# Patient Record
Sex: Male | Born: 1945 | Race: White | Hispanic: No | Marital: Married | Smoking: Former smoker
Health system: Southern US, Community
[De-identification: ages and names within clinical notes are randomized; demographics above are authoritative.]

## PROBLEM LIST (undated history)

## (undated) DIAGNOSIS — E669 Obesity, unspecified: Secondary | ICD-10-CM

## (undated) DIAGNOSIS — R079 Chest pain, unspecified: Secondary | ICD-10-CM

## (undated) DIAGNOSIS — E1169 Type 2 diabetes mellitus with other specified complication: Secondary | ICD-10-CM

## (undated) DIAGNOSIS — E785 Hyperlipidemia, unspecified: Secondary | ICD-10-CM

## (undated) DIAGNOSIS — I480 Paroxysmal atrial fibrillation: Secondary | ICD-10-CM

## (undated) DIAGNOSIS — I10 Essential (primary) hypertension: Secondary | ICD-10-CM

## (undated) DIAGNOSIS — I350 Nonrheumatic aortic (valve) stenosis: Secondary | ICD-10-CM

## (undated) DIAGNOSIS — G4730 Sleep apnea, unspecified: Secondary | ICD-10-CM

## (undated) DIAGNOSIS — G4733 Obstructive sleep apnea (adult) (pediatric): Secondary | ICD-10-CM

## (undated) HISTORY — DX: Paroxysmal atrial fibrillation: I48.0

## (undated) HISTORY — DX: Nonrheumatic aortic (valve) stenosis: I35.0

## (undated) HISTORY — DX: Sleep apnea, unspecified: G47.30

## (undated) HISTORY — DX: Type 2 diabetes mellitus with other specified complication: E11.69

## (undated) HISTORY — DX: Obesity, unspecified: E66.9

## (undated) HISTORY — DX: Chest pain, unspecified: R07.9

## (undated) HISTORY — DX: Obstructive sleep apnea (adult) (pediatric): G47.33

## (undated) HISTORY — DX: Essential (primary) hypertension: I10

## (undated) HISTORY — DX: Hyperlipidemia, unspecified: E78.5

---

## 2002-04-24 ENCOUNTER — Encounter: Admission: RE | Admit: 2002-04-24 | Discharge: 2002-04-24 | Payer: Self-pay | Admitting: Emergency Medicine

## 2002-04-24 ENCOUNTER — Encounter: Payer: Self-pay | Admitting: Emergency Medicine

## 2003-05-20 ENCOUNTER — Encounter: Admission: RE | Admit: 2003-05-20 | Discharge: 2003-05-20 | Payer: Self-pay | Admitting: Emergency Medicine

## 2004-07-24 ENCOUNTER — Encounter: Admission: RE | Admit: 2004-07-24 | Discharge: 2004-07-24 | Payer: Self-pay | Admitting: Emergency Medicine

## 2005-07-20 ENCOUNTER — Ambulatory Visit (HOSPITAL_COMMUNITY): Admission: RE | Admit: 2005-07-20 | Discharge: 2005-07-20 | Payer: Self-pay | Admitting: Cardiology

## 2005-07-20 ENCOUNTER — Encounter (INDEPENDENT_AMBULATORY_CARE_PROVIDER_SITE_OTHER): Payer: Self-pay | Admitting: Cardiology

## 2006-01-06 ENCOUNTER — Encounter: Admission: RE | Admit: 2006-01-06 | Discharge: 2006-01-06 | Payer: Self-pay | Admitting: Emergency Medicine

## 2006-01-08 ENCOUNTER — Encounter: Admission: RE | Admit: 2006-01-08 | Discharge: 2006-01-08 | Payer: Self-pay | Admitting: Emergency Medicine

## 2006-04-14 ENCOUNTER — Encounter: Admission: RE | Admit: 2006-04-14 | Discharge: 2006-04-14 | Payer: Self-pay | Admitting: Emergency Medicine

## 2008-12-06 ENCOUNTER — Encounter: Admission: RE | Admit: 2008-12-06 | Discharge: 2008-12-06 | Payer: Self-pay | Admitting: Cardiovascular Disease

## 2008-12-12 ENCOUNTER — Ambulatory Visit (HOSPITAL_COMMUNITY): Admission: RE | Admit: 2008-12-12 | Discharge: 2008-12-12 | Payer: Self-pay | Admitting: Cardiovascular Disease

## 2008-12-12 HISTORY — PX: CARDIAC CATHETERIZATION: SHX172

## 2009-01-01 ENCOUNTER — Ambulatory Visit: Payer: Self-pay | Admitting: Cardiothoracic Surgery

## 2009-01-10 ENCOUNTER — Ambulatory Visit: Payer: Self-pay | Admitting: Cardiothoracic Surgery

## 2009-01-13 ENCOUNTER — Encounter: Payer: Self-pay | Admitting: Cardiothoracic Surgery

## 2009-01-13 ENCOUNTER — Ambulatory Visit (HOSPITAL_COMMUNITY): Admission: RE | Admit: 2009-01-13 | Discharge: 2009-01-13 | Payer: Self-pay | Admitting: Cardiothoracic Surgery

## 2009-01-15 ENCOUNTER — Inpatient Hospital Stay (HOSPITAL_COMMUNITY): Admission: RE | Admit: 2009-01-15 | Discharge: 2009-01-22 | Payer: Self-pay | Admitting: Cardiothoracic Surgery

## 2009-01-15 ENCOUNTER — Ambulatory Visit: Payer: Self-pay | Admitting: Cardiothoracic Surgery

## 2009-01-15 HISTORY — PX: AORTIC VALVE REPLACEMENT: SHX41

## 2009-02-17 ENCOUNTER — Encounter: Admission: RE | Admit: 2009-02-17 | Discharge: 2009-02-17 | Payer: Self-pay | Admitting: Cardiothoracic Surgery

## 2009-02-17 ENCOUNTER — Ambulatory Visit: Payer: Self-pay | Admitting: Cardiothoracic Surgery

## 2009-02-26 ENCOUNTER — Ambulatory Visit: Payer: Self-pay | Admitting: Cardiothoracic Surgery

## 2009-03-26 ENCOUNTER — Encounter: Admission: RE | Admit: 2009-03-26 | Discharge: 2009-03-26 | Payer: Self-pay | Admitting: Cardiothoracic Surgery

## 2009-03-26 ENCOUNTER — Ambulatory Visit: Payer: Self-pay | Admitting: Cardiothoracic Surgery

## 2009-04-09 ENCOUNTER — Ambulatory Visit: Payer: Self-pay | Admitting: Cardiothoracic Surgery

## 2009-04-17 ENCOUNTER — Encounter: Payer: Self-pay | Admitting: Cardiothoracic Surgery

## 2009-04-17 ENCOUNTER — Ambulatory Visit: Admission: RE | Admit: 2009-04-17 | Discharge: 2009-04-17 | Payer: Self-pay | Admitting: Cardiothoracic Surgery

## 2009-04-21 ENCOUNTER — Inpatient Hospital Stay (HOSPITAL_COMMUNITY): Admission: RE | Admit: 2009-04-21 | Discharge: 2009-04-26 | Payer: Self-pay | Admitting: Cardiothoracic Surgery

## 2009-04-21 ENCOUNTER — Encounter: Payer: Self-pay | Admitting: Cardiothoracic Surgery

## 2009-04-21 HISTORY — PX: OTHER SURGICAL HISTORY: SHX169

## 2009-04-24 ENCOUNTER — Ambulatory Visit: Payer: Self-pay | Admitting: Cardiothoracic Surgery

## 2009-05-07 ENCOUNTER — Encounter: Admission: RE | Admit: 2009-05-07 | Discharge: 2009-05-07 | Payer: Self-pay | Admitting: Cardiothoracic Surgery

## 2009-05-07 ENCOUNTER — Ambulatory Visit: Payer: Self-pay | Admitting: Cardiothoracic Surgery

## 2009-05-28 ENCOUNTER — Ambulatory Visit: Payer: Self-pay | Admitting: Cardiothoracic Surgery

## 2009-08-06 ENCOUNTER — Encounter: Admission: RE | Admit: 2009-08-06 | Discharge: 2009-08-06 | Payer: Self-pay | Admitting: Cardiothoracic Surgery

## 2009-08-06 ENCOUNTER — Ambulatory Visit: Payer: Self-pay | Admitting: Cardiothoracic Surgery

## 2009-12-17 ENCOUNTER — Encounter: Admission: RE | Admit: 2009-12-17 | Discharge: 2009-12-17 | Payer: Self-pay | Admitting: Cardiothoracic Surgery

## 2009-12-17 ENCOUNTER — Ambulatory Visit: Payer: Self-pay | Admitting: Cardiothoracic Surgery

## 2010-02-22 ENCOUNTER — Encounter: Payer: Self-pay | Admitting: Cardiothoracic Surgery

## 2010-02-22 ENCOUNTER — Encounter: Payer: Self-pay | Admitting: Emergency Medicine

## 2010-04-27 LAB — CBC
HCT: 29.7 % — ABNORMAL LOW (ref 39.0–52.0)
HCT: 32.5 % — ABNORMAL LOW (ref 39.0–52.0)
HCT: 32.6 % — ABNORMAL LOW (ref 39.0–52.0)
HCT: 35.5 % — ABNORMAL LOW (ref 39.0–52.0)
HCT: 43 % (ref 39.0–52.0)
Hemoglobin: 10.1 g/dL — ABNORMAL LOW (ref 13.0–17.0)
Hemoglobin: 10.9 g/dL — ABNORMAL LOW (ref 13.0–17.0)
Hemoglobin: 11 g/dL — ABNORMAL LOW (ref 13.0–17.0)
Hemoglobin: 12.1 g/dL — ABNORMAL LOW (ref 13.0–17.0)
Hemoglobin: 14.1 g/dL (ref 13.0–17.0)
MCHC: 32.8 g/dL (ref 30.0–36.0)
MCHC: 33.7 g/dL (ref 30.0–36.0)
MCHC: 33.7 g/dL (ref 30.0–36.0)
MCHC: 34.1 g/dL (ref 30.0–36.0)
MCHC: 34.1 g/dL (ref 30.0–36.0)
MCV: 87.6 fL (ref 78.0–100.0)
MCV: 88.4 fL (ref 78.0–100.0)
MCV: 88.7 fL (ref 78.0–100.0)
MCV: 88.7 fL (ref 78.0–100.0)
MCV: 88.8 fL (ref 78.0–100.0)
Platelets: 115 10*3/uL — ABNORMAL LOW (ref 150–400)
Platelets: 136 10*3/uL — ABNORMAL LOW (ref 150–400)
Platelets: 137 10*3/uL — ABNORMAL LOW (ref 150–400)
Platelets: 149 10*3/uL — ABNORMAL LOW (ref 150–400)
Platelets: 165 10*3/uL (ref 150–400)
RBC: 3.35 MIL/uL — ABNORMAL LOW (ref 4.22–5.81)
RBC: 3.66 MIL/uL — ABNORMAL LOW (ref 4.22–5.81)
RBC: 3.73 MIL/uL — ABNORMAL LOW (ref 4.22–5.81)
RBC: 4 MIL/uL — ABNORMAL LOW (ref 4.22–5.81)
RBC: 4.86 MIL/uL (ref 4.22–5.81)
RDW: 15.4 % (ref 11.5–15.5)
RDW: 15.6 % — ABNORMAL HIGH (ref 11.5–15.5)
RDW: 15.8 % — ABNORMAL HIGH (ref 11.5–15.5)
RDW: 15.8 % — ABNORMAL HIGH (ref 11.5–15.5)
RDW: 15.8 % — ABNORMAL HIGH (ref 11.5–15.5)
WBC: 12.2 10*3/uL — ABNORMAL HIGH (ref 4.0–10.5)
WBC: 5.6 10*3/uL (ref 4.0–10.5)
WBC: 7.4 10*3/uL (ref 4.0–10.5)
WBC: 8.2 10*3/uL (ref 4.0–10.5)
WBC: 8.6 10*3/uL (ref 4.0–10.5)

## 2010-04-27 LAB — GLUCOSE, CAPILLARY
Glucose-Capillary: 101 mg/dL — ABNORMAL HIGH (ref 70–99)
Glucose-Capillary: 107 mg/dL — ABNORMAL HIGH (ref 70–99)
Glucose-Capillary: 107 mg/dL — ABNORMAL HIGH (ref 70–99)
Glucose-Capillary: 108 mg/dL — ABNORMAL HIGH (ref 70–99)
Glucose-Capillary: 110 mg/dL — ABNORMAL HIGH (ref 70–99)
Glucose-Capillary: 113 mg/dL — ABNORMAL HIGH (ref 70–99)
Glucose-Capillary: 122 mg/dL — ABNORMAL HIGH (ref 70–99)
Glucose-Capillary: 122 mg/dL — ABNORMAL HIGH (ref 70–99)
Glucose-Capillary: 122 mg/dL — ABNORMAL HIGH (ref 70–99)
Glucose-Capillary: 123 mg/dL — ABNORMAL HIGH (ref 70–99)
Glucose-Capillary: 129 mg/dL — ABNORMAL HIGH (ref 70–99)
Glucose-Capillary: 133 mg/dL — ABNORMAL HIGH (ref 70–99)
Glucose-Capillary: 133 mg/dL — ABNORMAL HIGH (ref 70–99)
Glucose-Capillary: 138 mg/dL — ABNORMAL HIGH (ref 70–99)
Glucose-Capillary: 144 mg/dL — ABNORMAL HIGH (ref 70–99)
Glucose-Capillary: 147 mg/dL — ABNORMAL HIGH (ref 70–99)
Glucose-Capillary: 151 mg/dL — ABNORMAL HIGH (ref 70–99)
Glucose-Capillary: 152 mg/dL — ABNORMAL HIGH (ref 70–99)
Glucose-Capillary: 153 mg/dL — ABNORMAL HIGH (ref 70–99)
Glucose-Capillary: 155 mg/dL — ABNORMAL HIGH (ref 70–99)
Glucose-Capillary: 164 mg/dL — ABNORMAL HIGH (ref 70–99)
Glucose-Capillary: 165 mg/dL — ABNORMAL HIGH (ref 70–99)
Glucose-Capillary: 174 mg/dL — ABNORMAL HIGH (ref 70–99)
Glucose-Capillary: 176 mg/dL — ABNORMAL HIGH (ref 70–99)
Glucose-Capillary: 69 mg/dL — ABNORMAL LOW (ref 70–99)
Glucose-Capillary: 86 mg/dL (ref 70–99)
Glucose-Capillary: 88 mg/dL (ref 70–99)
Glucose-Capillary: 95 mg/dL (ref 70–99)
Glucose-Capillary: 99 mg/dL (ref 70–99)

## 2010-04-27 LAB — POCT I-STAT 3, ART BLOOD GAS (G3+)
Acid-base deficit: 2 mmol/L (ref 0.0–2.0)
Acid-base deficit: 3 mmol/L — ABNORMAL HIGH (ref 0.0–2.0)
Bicarbonate: 22.6 mEq/L (ref 20.0–24.0)
Bicarbonate: 24.3 mEq/L — ABNORMAL HIGH (ref 20.0–24.0)
O2 Saturation: 87 %
O2 Saturation: 88 %
Patient temperature: 97.5
Patient temperature: 98.3
TCO2: 24 mmol/L (ref 0–100)
TCO2: 26 mmol/L (ref 0–100)
pCO2 arterial: 41.2 mmHg (ref 35.0–45.0)
pCO2 arterial: 45.2 mmHg — ABNORMAL HIGH (ref 35.0–45.0)
pH, Arterial: 7.338 — ABNORMAL LOW (ref 7.350–7.450)
pH, Arterial: 7.345 — ABNORMAL LOW (ref 7.350–7.450)
pO2, Arterial: 55 mmHg — ABNORMAL LOW (ref 80.0–100.0)
pO2, Arterial: 58 mmHg — ABNORMAL LOW (ref 80.0–100.0)

## 2010-04-27 LAB — BASIC METABOLIC PANEL
BUN: 19 mg/dL (ref 6–23)
BUN: 34 mg/dL — ABNORMAL HIGH (ref 6–23)
CO2: 22 mEq/L (ref 19–32)
CO2: 28 mEq/L (ref 19–32)
Calcium: 8.1 mg/dL — ABNORMAL LOW (ref 8.4–10.5)
Calcium: 8.7 mg/dL (ref 8.4–10.5)
Chloride: 102 mEq/L (ref 96–112)
Chloride: 97 mEq/L (ref 96–112)
Creatinine, Ser: 1.32 mg/dL (ref 0.4–1.5)
Creatinine, Ser: 1.85 mg/dL — ABNORMAL HIGH (ref 0.4–1.5)
GFR calc Af Amer: 45 mL/min — ABNORMAL LOW (ref >60)
GFR calc Af Amer: >60 mL/min (ref >60)
GFR calc non Af Amer: 37 mL/min — ABNORMAL LOW (ref >60)
GFR calc non Af Amer: 54 mL/min — ABNORMAL LOW (ref >60)
Glucose, Bld: 107 mg/dL — ABNORMAL HIGH (ref 70–99)
Glucose, Bld: 177 mg/dL — ABNORMAL HIGH (ref 70–99)
Potassium: 3.8 mEq/L (ref 3.5–5.1)
Potassium: 5.4 mEq/L — ABNORMAL HIGH (ref 3.5–5.1)
Sodium: 133 mEq/L — ABNORMAL LOW (ref 135–145)
Sodium: 133 mEq/L — ABNORMAL LOW (ref 135–145)

## 2010-04-27 LAB — COMPREHENSIVE METABOLIC PANEL
ALT: 19 U/L (ref 0–53)
ALT: 21 U/L (ref 0–53)
ALT: 27 U/L (ref 0–53)
AST: 22 U/L (ref 0–37)
AST: 24 U/L (ref 0–37)
AST: 24 U/L (ref 0–37)
Albumin: 2.8 g/dL — ABNORMAL LOW (ref 3.5–5.2)
Albumin: 2.9 g/dL — ABNORMAL LOW (ref 3.5–5.2)
Albumin: 4.2 g/dL (ref 3.5–5.2)
Alkaline Phosphatase: 51 U/L (ref 39–117)
Alkaline Phosphatase: 55 U/L (ref 39–117)
Alkaline Phosphatase: 77 U/L (ref 39–117)
BUN: 14 mg/dL (ref 6–23)
BUN: 25 mg/dL — ABNORMAL HIGH (ref 6–23)
BUN: 29 mg/dL — ABNORMAL HIGH (ref 6–23)
CO2: 25 mEq/L (ref 19–32)
CO2: 26 mEq/L (ref 19–32)
CO2: 26 mEq/L (ref 19–32)
Calcium: 8.1 mg/dL — ABNORMAL LOW (ref 8.4–10.5)
Calcium: 8.3 mg/dL — ABNORMAL LOW (ref 8.4–10.5)
Calcium: 9.8 mg/dL (ref 8.4–10.5)
Chloride: 100 mEq/L (ref 96–112)
Chloride: 103 mEq/L (ref 96–112)
Chloride: 99 mEq/L (ref 96–112)
Creatinine, Ser: 1.13 mg/dL (ref 0.4–1.5)
Creatinine, Ser: 1.5 mg/dL (ref 0.4–1.5)
Creatinine, Ser: 1.51 mg/dL — ABNORMAL HIGH (ref 0.4–1.5)
GFR calc Af Amer: 56 mL/min — ABNORMAL LOW (ref >60)
GFR calc Af Amer: 57 mL/min — ABNORMAL LOW (ref >60)
GFR calc Af Amer: >60 mL/min (ref >60)
GFR calc non Af Amer: 47 mL/min — ABNORMAL LOW (ref >60)
GFR calc non Af Amer: 47 mL/min — ABNORMAL LOW (ref >60)
GFR calc non Af Amer: >60 mL/min (ref >60)
Glucose, Bld: 118 mg/dL — ABNORMAL HIGH (ref 70–99)
Glucose, Bld: 146 mg/dL — ABNORMAL HIGH (ref 70–99)
Glucose, Bld: 150 mg/dL — ABNORMAL HIGH (ref 70–99)
Potassium: 3.5 mEq/L (ref 3.5–5.1)
Potassium: 3.8 mEq/L (ref 3.5–5.1)
Potassium: 4.2 mEq/L (ref 3.5–5.1)
Sodium: 131 mEq/L — ABNORMAL LOW (ref 135–145)
Sodium: 134 mEq/L — ABNORMAL LOW (ref 135–145)
Sodium: 135 mEq/L (ref 135–145)
Total Bilirubin: 0.6 mg/dL (ref 0.3–1.2)
Total Bilirubin: 0.7 mg/dL (ref 0.3–1.2)
Total Bilirubin: 0.7 mg/dL (ref 0.3–1.2)
Total Protein: 5.5 g/dL — ABNORMAL LOW (ref 6.0–8.3)
Total Protein: 5.9 g/dL — ABNORMAL LOW (ref 6.0–8.3)
Total Protein: 6.8 g/dL (ref 6.0–8.3)

## 2010-04-27 LAB — BLOOD GAS, ARTERIAL
Acid-Base Excess: 2.2 mmol/L — ABNORMAL HIGH (ref 0.0–2.0)
Acid-base deficit: 0.8 mmol/L (ref 0.0–2.0)
Acid-base deficit: 1.9 mmol/L (ref 0.0–2.0)
Bicarbonate: 23.7 mEq/L (ref 20.0–24.0)
Bicarbonate: 24.4 mEq/L — ABNORMAL HIGH (ref 20.0–24.0)
Bicarbonate: 25.9 mEq/L — ABNORMAL HIGH (ref 20.0–24.0)
Drawn by: 206361
Drawn by: 32526
Drawn by: 32526
FIO2: 0.21 %
FIO2: 0.4 %
O2 Content: 3 L/min
O2 Saturation: 95.2 %
O2 Saturation: 95.2 %
O2 Saturation: 95.2 %
PEEP: 5 cmH2O
Patient temperature: 98.6
Patient temperature: 98.6
Patient temperature: 98.6
Pressure support: 15 cmH2O
TCO2: 25.2 mmol/L (ref 0–100)
TCO2: 25.9 mmol/L (ref 0–100)
TCO2: 27.1 mmol/L (ref 0–100)
pCO2 arterial: 38.5 mmHg (ref 35.0–45.0)
pCO2 arterial: 47.8 mmHg — ABNORMAL HIGH (ref 35.0–45.0)
pCO2 arterial: 50.3 mmHg — ABNORMAL HIGH (ref 35.0–45.0)
pH, Arterial: 7.294 — ABNORMAL LOW (ref 7.350–7.450)
pH, Arterial: 7.328 — ABNORMAL LOW (ref 7.350–7.450)
pH, Arterial: 7.444 (ref 7.350–7.450)
pO2, Arterial: 74.6 mmHg — ABNORMAL LOW (ref 80.0–100.0)
pO2, Arterial: 80.5 mmHg (ref 80.0–100.0)
pO2, Arterial: 80.9 mmHg (ref 80.0–100.0)

## 2010-04-27 LAB — APTT: aPTT: 27 seconds (ref 24–37)

## 2010-04-27 LAB — TYPE AND SCREEN
ABO/RH(D): A POS
Antibody Screen: NEGATIVE

## 2010-04-27 LAB — POCT I-STAT, CHEM 8
BUN: 29 mg/dL — ABNORMAL HIGH (ref 6–23)
BUN: 36 mg/dL — ABNORMAL HIGH (ref 6–23)
Calcium, Ion: 1.15 mmol/L (ref 1.12–1.32)
Calcium, Ion: 1.16 mmol/L (ref 1.12–1.32)
Chloride: 105 mEq/L (ref 96–112)
Chloride: 106 mEq/L (ref 96–112)
Creatinine, Ser: 1.5 mg/dL (ref 0.4–1.5)
Creatinine, Ser: 1.9 mg/dL — ABNORMAL HIGH (ref 0.4–1.5)
Glucose, Bld: 100 mg/dL — ABNORMAL HIGH (ref 70–99)
Glucose, Bld: 158 mg/dL — ABNORMAL HIGH (ref 70–99)
HCT: 34 % — ABNORMAL LOW (ref 39.0–52.0)
HCT: 36 % — ABNORMAL LOW (ref 39.0–52.0)
Hemoglobin: 11.6 g/dL — ABNORMAL LOW (ref 13.0–17.0)
Hemoglobin: 12.2 g/dL — ABNORMAL LOW (ref 13.0–17.0)
Potassium: 4.5 mEq/L (ref 3.5–5.1)
Potassium: 4.6 mEq/L (ref 3.5–5.1)
Sodium: 135 mEq/L (ref 135–145)
Sodium: 135 mEq/L (ref 135–145)
TCO2: 24 mmol/L (ref 0–100)
TCO2: 25 mmol/L (ref 0–100)

## 2010-04-27 LAB — URINALYSIS, ROUTINE W REFLEX MICROSCOPIC
Bilirubin Urine: NEGATIVE
Glucose, UA: NEGATIVE mg/dL
Hgb urine dipstick: NEGATIVE
Ketones, ur: NEGATIVE mg/dL
Nitrite: NEGATIVE
Protein, ur: NEGATIVE mg/dL
Specific Gravity, Urine: 1.008 (ref 1.005–1.030)
Urobilinogen, UA: 0.2 mg/dL (ref 0.0–1.0)
pH: 5.5 (ref 5.0–8.0)

## 2010-04-27 LAB — PROTIME-INR
INR: 1.04 (ref 0.00–1.49)
Prothrombin Time: 13.5 seconds (ref 11.6–15.2)

## 2010-04-27 LAB — TISSUE CULTURE: Culture: NO GROWTH

## 2010-04-27 LAB — MRSA PCR SCREENING: MRSA by PCR: NEGATIVE

## 2010-04-27 LAB — SURGICAL PCR SCREEN
MRSA, PCR: NEGATIVE
Staphylococcus aureus: NEGATIVE

## 2010-05-04 LAB — COMPREHENSIVE METABOLIC PANEL
ALT: 29 U/L (ref 0–53)
AST: 41 U/L — ABNORMAL HIGH (ref 0–37)
Albumin: 3.3 g/dL — ABNORMAL LOW (ref 3.5–5.2)
Alkaline Phosphatase: 43 U/L (ref 39–117)
BUN: 40 mg/dL — ABNORMAL HIGH (ref 6–23)
CO2: 26 mEq/L (ref 19–32)
Calcium: 8.4 mg/dL (ref 8.4–10.5)
Chloride: 101 mEq/L (ref 96–112)
Creatinine, Ser: 2.41 mg/dL — ABNORMAL HIGH (ref 0.4–1.5)
GFR calc Af Amer: 33 mL/min — ABNORMAL LOW (ref >60)
GFR calc non Af Amer: 27 mL/min — ABNORMAL LOW (ref >60)
Glucose, Bld: 131 mg/dL — ABNORMAL HIGH (ref 70–99)
Potassium: 4.4 mEq/L (ref 3.5–5.1)
Sodium: 135 mEq/L (ref 135–145)
Total Bilirubin: 0.7 mg/dL (ref 0.3–1.2)
Total Protein: 5.5 g/dL — ABNORMAL LOW (ref 6.0–8.3)

## 2010-05-04 LAB — BASIC METABOLIC PANEL
BUN: 16 mg/dL (ref 6–23)
BUN: 19 mg/dL (ref 6–23)
BUN: 22 mg/dL (ref 6–23)
BUN: 31 mg/dL — ABNORMAL HIGH (ref 6–23)
BUN: 45 mg/dL — ABNORMAL HIGH (ref 6–23)
CO2: 22 mEq/L (ref 19–32)
CO2: 25 mEq/L (ref 19–32)
CO2: 27 mEq/L (ref 19–32)
CO2: 28 mEq/L (ref 19–32)
CO2: 29 mEq/L (ref 19–32)
Calcium: 8 mg/dL — ABNORMAL LOW (ref 8.4–10.5)
Calcium: 8.1 mg/dL — ABNORMAL LOW (ref 8.4–10.5)
Calcium: 8.1 mg/dL — ABNORMAL LOW (ref 8.4–10.5)
Calcium: 8.6 mg/dL (ref 8.4–10.5)
Calcium: 8.6 mg/dL (ref 8.4–10.5)
Chloride: 100 mEq/L (ref 96–112)
Chloride: 101 mEq/L (ref 96–112)
Chloride: 102 mEq/L (ref 96–112)
Chloride: 105 mEq/L (ref 96–112)
Chloride: 99 mEq/L (ref 96–112)
Creatinine, Ser: 1.5 mg/dL (ref 0.4–1.5)
Creatinine, Ser: 1.56 mg/dL — ABNORMAL HIGH (ref 0.4–1.5)
Creatinine, Ser: 1.58 mg/dL — ABNORMAL HIGH (ref 0.4–1.5)
Creatinine, Ser: 1.96 mg/dL — ABNORMAL HIGH (ref 0.4–1.5)
Creatinine, Ser: 1.99 mg/dL — ABNORMAL HIGH (ref 0.4–1.5)
GFR calc Af Amer: 41 mL/min — ABNORMAL LOW (ref >60)
GFR calc Af Amer: 42 mL/min — ABNORMAL LOW (ref >60)
GFR calc Af Amer: 54 mL/min — ABNORMAL LOW (ref >60)
GFR calc Af Amer: 54 mL/min — ABNORMAL LOW (ref >60)
GFR calc Af Amer: 57 mL/min — ABNORMAL LOW (ref >60)
GFR calc non Af Amer: 34 mL/min — ABNORMAL LOW (ref >60)
GFR calc non Af Amer: 34 mL/min — ABNORMAL LOW (ref >60)
GFR calc non Af Amer: 44 mL/min — ABNORMAL LOW (ref >60)
GFR calc non Af Amer: 45 mL/min — ABNORMAL LOW (ref >60)
GFR calc non Af Amer: 47 mL/min — ABNORMAL LOW (ref >60)
Glucose, Bld: 118 mg/dL — ABNORMAL HIGH (ref 70–99)
Glucose, Bld: 160 mg/dL — ABNORMAL HIGH (ref 70–99)
Glucose, Bld: 286 mg/dL — ABNORMAL HIGH (ref 70–99)
Glucose, Bld: 55 mg/dL — ABNORMAL LOW (ref 70–99)
Glucose, Bld: 78 mg/dL (ref 70–99)
Potassium: 3.7 mEq/L (ref 3.5–5.1)
Potassium: 3.7 mEq/L (ref 3.5–5.1)
Potassium: 3.9 mEq/L (ref 3.5–5.1)
Potassium: 4.1 mEq/L (ref 3.5–5.1)
Potassium: 4.7 mEq/L (ref 3.5–5.1)
Sodium: 132 mEq/L — ABNORMAL LOW (ref 135–145)
Sodium: 132 mEq/L — ABNORMAL LOW (ref 135–145)
Sodium: 134 mEq/L — ABNORMAL LOW (ref 135–145)
Sodium: 134 mEq/L — ABNORMAL LOW (ref 135–145)
Sodium: 138 mEq/L (ref 135–145)

## 2010-05-04 LAB — CBC
HCT: 23.8 % — ABNORMAL LOW (ref 39.0–52.0)
HCT: 24.1 % — ABNORMAL LOW (ref 39.0–52.0)
HCT: 24.8 % — ABNORMAL LOW (ref 39.0–52.0)
HCT: 25.5 % — ABNORMAL LOW (ref 39.0–52.0)
HCT: 25.5 % — ABNORMAL LOW (ref 39.0–52.0)
Hemoglobin: 8.1 g/dL — ABNORMAL LOW (ref 13.0–17.0)
Hemoglobin: 8.3 g/dL — ABNORMAL LOW (ref 13.0–17.0)
Hemoglobin: 8.4 g/dL — ABNORMAL LOW (ref 13.0–17.0)
Hemoglobin: 8.7 g/dL — ABNORMAL LOW (ref 13.0–17.0)
Hemoglobin: 8.8 g/dL — ABNORMAL LOW (ref 13.0–17.0)
MCHC: 34 g/dL (ref 30.0–36.0)
MCHC: 34.1 g/dL (ref 30.0–36.0)
MCHC: 34.2 g/dL (ref 30.0–36.0)
MCHC: 34.2 g/dL (ref 30.0–36.0)
MCHC: 34.3 g/dL (ref 30.0–36.0)
MCV: 93.8 fL (ref 78.0–100.0)
MCV: 94 fL (ref 78.0–100.0)
MCV: 94.2 fL (ref 78.0–100.0)
MCV: 94.5 fL (ref 78.0–100.0)
MCV: 94.9 fL (ref 78.0–100.0)
Platelets: 118 10*3/uL — ABNORMAL LOW (ref 150–400)
Platelets: 127 10*3/uL — ABNORMAL LOW (ref 150–400)
Platelets: 81 10*3/uL — ABNORMAL LOW (ref 150–400)
Platelets: 86 10*3/uL — ABNORMAL LOW (ref 150–400)
Platelets: 94 10*3/uL — ABNORMAL LOW (ref 150–400)
RBC: 2.52 MIL/uL — ABNORMAL LOW (ref 4.22–5.81)
RBC: 2.56 MIL/uL — ABNORMAL LOW (ref 4.22–5.81)
RBC: 2.64 MIL/uL — ABNORMAL LOW (ref 4.22–5.81)
RBC: 2.68 MIL/uL — ABNORMAL LOW (ref 4.22–5.81)
RBC: 2.72 MIL/uL — ABNORMAL LOW (ref 4.22–5.81)
RDW: 12.8 % (ref 11.5–15.5)
RDW: 13.1 % (ref 11.5–15.5)
RDW: 13.2 % (ref 11.5–15.5)
RDW: 13.5 % (ref 11.5–15.5)
RDW: 13.6 % (ref 11.5–15.5)
WBC: 10.4 10*3/uL (ref 4.0–10.5)
WBC: 10.6 10*3/uL — ABNORMAL HIGH (ref 4.0–10.5)
WBC: 11 10*3/uL — ABNORMAL HIGH (ref 4.0–10.5)
WBC: 11.7 10*3/uL — ABNORMAL HIGH (ref 4.0–10.5)
WBC: 8.8 10*3/uL (ref 4.0–10.5)

## 2010-05-04 LAB — POCT I-STAT 3, ART BLOOD GAS (G3+)
Acid-base deficit: 5 mmol/L — ABNORMAL HIGH (ref 0.0–2.0)
Acid-base deficit: 6 mmol/L — ABNORMAL HIGH (ref 0.0–2.0)
Bicarbonate: 19.4 mEq/L — ABNORMAL LOW (ref 20.0–24.0)
Bicarbonate: 20.1 mEq/L (ref 20.0–24.0)
O2 Saturation: 91 %
O2 Saturation: 92 %
Patient temperature: 37.3
Patient temperature: 37.4
TCO2: 21 mmol/L (ref 0–100)
TCO2: 21 mmol/L (ref 0–100)
pCO2 arterial: 38.6 mmHg (ref 35.0–45.0)
pCO2 arterial: 38.7 mmHg (ref 35.0–45.0)
pH, Arterial: 7.311 — ABNORMAL LOW (ref 7.350–7.450)
pH, Arterial: 7.324 — ABNORMAL LOW (ref 7.350–7.450)
pO2, Arterial: 66 mmHg — ABNORMAL LOW (ref 80.0–100.0)
pO2, Arterial: 70 mmHg — ABNORMAL LOW (ref 80.0–100.0)

## 2010-05-04 LAB — PROTIME-INR
INR: 1.19 (ref 0.00–1.49)
INR: 1.2 (ref 0.00–1.49)
INR: 1.5 — ABNORMAL HIGH (ref 0.00–1.49)
INR: 1.71 — ABNORMAL HIGH (ref 0.00–1.49)
INR: 1.71 — ABNORMAL HIGH (ref 0.00–1.49)
Prothrombin Time: 15 seconds (ref 11.6–15.2)
Prothrombin Time: 15.1 seconds (ref 11.6–15.2)
Prothrombin Time: 18 seconds — ABNORMAL HIGH (ref 11.6–15.2)
Prothrombin Time: 19.9 seconds — ABNORMAL HIGH (ref 11.6–15.2)
Prothrombin Time: 19.9 seconds — ABNORMAL HIGH (ref 11.6–15.2)

## 2010-05-04 LAB — GLUCOSE, CAPILLARY
Glucose-Capillary: 101 mg/dL — ABNORMAL HIGH (ref 70–99)
Glucose-Capillary: 102 mg/dL — ABNORMAL HIGH (ref 70–99)
Glucose-Capillary: 104 mg/dL — ABNORMAL HIGH (ref 70–99)
Glucose-Capillary: 105 mg/dL — ABNORMAL HIGH (ref 70–99)
Glucose-Capillary: 106 mg/dL — ABNORMAL HIGH (ref 70–99)
Glucose-Capillary: 108 mg/dL — ABNORMAL HIGH (ref 70–99)
Glucose-Capillary: 109 mg/dL — ABNORMAL HIGH (ref 70–99)
Glucose-Capillary: 111 mg/dL — ABNORMAL HIGH (ref 70–99)
Glucose-Capillary: 113 mg/dL — ABNORMAL HIGH (ref 70–99)
Glucose-Capillary: 120 mg/dL — ABNORMAL HIGH (ref 70–99)
Glucose-Capillary: 121 mg/dL — ABNORMAL HIGH (ref 70–99)
Glucose-Capillary: 123 mg/dL — ABNORMAL HIGH (ref 70–99)
Glucose-Capillary: 133 mg/dL — ABNORMAL HIGH (ref 70–99)
Glucose-Capillary: 135 mg/dL — ABNORMAL HIGH (ref 70–99)
Glucose-Capillary: 136 mg/dL — ABNORMAL HIGH (ref 70–99)
Glucose-Capillary: 139 mg/dL — ABNORMAL HIGH (ref 70–99)
Glucose-Capillary: 142 mg/dL — ABNORMAL HIGH (ref 70–99)
Glucose-Capillary: 151 mg/dL — ABNORMAL HIGH (ref 70–99)
Glucose-Capillary: 168 mg/dL — ABNORMAL HIGH (ref 70–99)
Glucose-Capillary: 177 mg/dL — ABNORMAL HIGH (ref 70–99)
Glucose-Capillary: 189 mg/dL — ABNORMAL HIGH (ref 70–99)
Glucose-Capillary: 215 mg/dL — ABNORMAL HIGH (ref 70–99)
Glucose-Capillary: 235 mg/dL — ABNORMAL HIGH (ref 70–99)
Glucose-Capillary: 406 mg/dL — ABNORMAL HIGH (ref 70–99)
Glucose-Capillary: 41 mg/dL — ABNORMAL LOW (ref 70–99)
Glucose-Capillary: 42 mg/dL — ABNORMAL LOW (ref 70–99)
Glucose-Capillary: 49 mg/dL — ABNORMAL LOW (ref 70–99)
Glucose-Capillary: 50 mg/dL — ABNORMAL LOW (ref 70–99)
Glucose-Capillary: 56 mg/dL — ABNORMAL LOW (ref 70–99)
Glucose-Capillary: 58 mg/dL — ABNORMAL LOW (ref 70–99)
Glucose-Capillary: 66 mg/dL — ABNORMAL LOW (ref 70–99)
Glucose-Capillary: 67 mg/dL — ABNORMAL LOW (ref 70–99)
Glucose-Capillary: 75 mg/dL (ref 70–99)
Glucose-Capillary: 79 mg/dL (ref 70–99)
Glucose-Capillary: 82 mg/dL (ref 70–99)
Glucose-Capillary: 86 mg/dL (ref 70–99)
Glucose-Capillary: 89 mg/dL (ref 70–99)
Glucose-Capillary: 89 mg/dL (ref 70–99)
Glucose-Capillary: 95 mg/dL (ref 70–99)
Glucose-Capillary: 99 mg/dL (ref 70–99)

## 2010-05-04 LAB — POCT I-STAT, CHEM 8
BUN: 22 mg/dL (ref 6–23)
Calcium, Ion: 1.26 mmol/L (ref 1.12–1.32)
Chloride: 102 mEq/L (ref 96–112)
Creatinine, Ser: 1.9 mg/dL — ABNORMAL HIGH (ref 0.4–1.5)
Glucose, Bld: 171 mg/dL — ABNORMAL HIGH (ref 70–99)
HCT: 25 % — ABNORMAL LOW (ref 39.0–52.0)
Hemoglobin: 8.5 g/dL — ABNORMAL LOW (ref 13.0–17.0)
Potassium: 4.5 mEq/L (ref 3.5–5.1)
Sodium: 136 mEq/L (ref 135–145)
TCO2: 25 mmol/L (ref 0–100)

## 2010-05-04 LAB — CREATININE, SERUM
Creatinine, Ser: 1.92 mg/dL — ABNORMAL HIGH (ref 0.4–1.5)
GFR calc Af Amer: 43 mL/min — ABNORMAL LOW (ref >60)
GFR calc non Af Amer: 35 mL/min — ABNORMAL LOW (ref >60)

## 2010-05-04 LAB — MAGNESIUM
Magnesium: 2.5 mg/dL (ref 1.5–2.5)
Magnesium: 2.5 mg/dL (ref 1.5–2.5)

## 2010-05-04 LAB — AMYLASE: Amylase: 59 U/L (ref 0–105)

## 2010-05-04 LAB — BRAIN NATRIURETIC PEPTIDE: Pro B Natriuretic peptide (BNP): 636 pg/mL — ABNORMAL HIGH (ref 0.0–100.0)

## 2010-05-05 LAB — POCT I-STAT 3, ART BLOOD GAS (G3+)
Acid-base deficit: 2 mmol/L (ref 0.0–2.0)
Acid-base deficit: 3 mmol/L — ABNORMAL HIGH (ref 0.0–2.0)
Acid-base deficit: 5 mmol/L — ABNORMAL HIGH (ref 0.0–2.0)
Bicarbonate: 20.6 mEq/L (ref 20.0–24.0)
Bicarbonate: 22.9 mEq/L (ref 20.0–24.0)
Bicarbonate: 23.2 mEq/L (ref 20.0–24.0)
Bicarbonate: 24.9 mEq/L — ABNORMAL HIGH (ref 20.0–24.0)
O2 Saturation: 100 %
O2 Saturation: 91 %
O2 Saturation: 91 %
O2 Saturation: 98 %
Patient temperature: 37
Patient temperature: 37.2
TCO2: 22 mmol/L (ref 0–100)
TCO2: 24 mmol/L (ref 0–100)
TCO2: 25 mmol/L (ref 0–100)
TCO2: 26 mmol/L (ref 0–100)
pCO2 arterial: 37.5 mmHg (ref 35.0–45.0)
pCO2 arterial: 39.7 mmHg (ref 35.0–45.0)
pCO2 arterial: 40.9 mmHg (ref 35.0–45.0)
pCO2 arterial: 47.5 mmHg — ABNORMAL HIGH (ref 35.0–45.0)
pH, Arterial: 7.297 — ABNORMAL LOW (ref 7.350–7.450)
pH, Arterial: 7.31 — ABNORMAL LOW (ref 7.350–7.450)
pH, Arterial: 7.393 (ref 7.350–7.450)
pH, Arterial: 7.405 (ref 7.350–7.450)
pO2, Arterial: 316 mmHg — ABNORMAL HIGH (ref 80.0–100.0)
pO2, Arterial: 68 mmHg — ABNORMAL LOW (ref 80.0–100.0)
pO2, Arterial: 68 mmHg — ABNORMAL LOW (ref 80.0–100.0)
pO2, Arterial: 99 mmHg (ref 80.0–100.0)

## 2010-05-05 LAB — GLUCOSE, CAPILLARY
Glucose-Capillary: 100 mg/dL — ABNORMAL HIGH (ref 70–99)
Glucose-Capillary: 112 mg/dL — ABNORMAL HIGH (ref 70–99)
Glucose-Capillary: 119 mg/dL — ABNORMAL HIGH (ref 70–99)
Glucose-Capillary: 125 mg/dL — ABNORMAL HIGH (ref 70–99)
Glucose-Capillary: 127 mg/dL — ABNORMAL HIGH (ref 70–99)
Glucose-Capillary: 140 mg/dL — ABNORMAL HIGH (ref 70–99)
Glucose-Capillary: 171 mg/dL — ABNORMAL HIGH (ref 70–99)
Glucose-Capillary: 95 mg/dL (ref 70–99)

## 2010-05-05 LAB — BLOOD GAS, ARTERIAL
Acid-base deficit: 0.3 mmol/L (ref 0.0–2.0)
Bicarbonate: 23.7 mEq/L (ref 20.0–24.0)
Drawn by: 206361
FIO2: 0.21 %
O2 Saturation: 96.6 %
Patient temperature: 98.6
TCO2: 24.8 mmol/L (ref 0–100)
pCO2 arterial: 37.5 mmHg (ref 35.0–45.0)
pH, Arterial: 7.417 (ref 7.350–7.450)
pO2, Arterial: 81.7 mmHg (ref 80.0–100.0)

## 2010-05-05 LAB — MAGNESIUM: Magnesium: 2.5 mg/dL (ref 1.5–2.5)

## 2010-05-05 LAB — POCT I-STAT GLUCOSE
Glucose, Bld: 109 mg/dL — ABNORMAL HIGH (ref 70–99)
Glucose, Bld: 150 mg/dL — ABNORMAL HIGH (ref 70–99)
Operator id: 3342
Operator id: 3342

## 2010-05-05 LAB — POCT I-STAT 4, (NA,K, GLUC, HGB,HCT)
Glucose, Bld: 103 mg/dL — ABNORMAL HIGH (ref 70–99)
Glucose, Bld: 108 mg/dL — ABNORMAL HIGH (ref 70–99)
Glucose, Bld: 136 mg/dL — ABNORMAL HIGH (ref 70–99)
Glucose, Bld: 155 mg/dL — ABNORMAL HIGH (ref 70–99)
Glucose, Bld: 157 mg/dL — ABNORMAL HIGH (ref 70–99)
Glucose, Bld: 167 mg/dL — ABNORMAL HIGH (ref 70–99)
HCT: 24 % — ABNORMAL LOW (ref 39.0–52.0)
HCT: 27 % — ABNORMAL LOW (ref 39.0–52.0)
HCT: 30 % — ABNORMAL LOW (ref 39.0–52.0)
HCT: 31 % — ABNORMAL LOW (ref 39.0–52.0)
HCT: 36 % — ABNORMAL LOW (ref 39.0–52.0)
HCT: 39 % (ref 39.0–52.0)
Hemoglobin: 10.2 g/dL — ABNORMAL LOW (ref 13.0–17.0)
Hemoglobin: 10.5 g/dL — ABNORMAL LOW (ref 13.0–17.0)
Hemoglobin: 12.2 g/dL — ABNORMAL LOW (ref 13.0–17.0)
Hemoglobin: 13.3 g/dL (ref 13.0–17.0)
Hemoglobin: 8.2 g/dL — ABNORMAL LOW (ref 13.0–17.0)
Hemoglobin: 9.2 g/dL — ABNORMAL LOW (ref 13.0–17.0)
Potassium: 3.8 mEq/L (ref 3.5–5.1)
Potassium: 3.9 mEq/L (ref 3.5–5.1)
Potassium: 3.9 mEq/L (ref 3.5–5.1)
Potassium: 4 mEq/L (ref 3.5–5.1)
Potassium: 4.3 mEq/L (ref 3.5–5.1)
Potassium: 4.9 mEq/L (ref 3.5–5.1)
Sodium: 130 mEq/L — ABNORMAL LOW (ref 135–145)
Sodium: 133 mEq/L — ABNORMAL LOW (ref 135–145)
Sodium: 135 mEq/L (ref 135–145)
Sodium: 137 mEq/L (ref 135–145)
Sodium: 138 mEq/L (ref 135–145)
Sodium: 138 mEq/L (ref 135–145)

## 2010-05-05 LAB — PREPARE PLATELETS

## 2010-05-05 LAB — CBC
HCT: 27 % — ABNORMAL LOW (ref 39.0–52.0)
HCT: 30.3 % — ABNORMAL LOW (ref 39.0–52.0)
HCT: 44.1 % (ref 39.0–52.0)
Hemoglobin: 10.6 g/dL — ABNORMAL LOW (ref 13.0–17.0)
Hemoglobin: 15 g/dL (ref 13.0–17.0)
Hemoglobin: 9.3 g/dL — ABNORMAL LOW (ref 13.0–17.0)
MCHC: 34 g/dL (ref 30.0–36.0)
MCHC: 34.4 g/dL (ref 30.0–36.0)
MCHC: 35.1 g/dL (ref 30.0–36.0)
MCV: 91.9 fL (ref 78.0–100.0)
MCV: 93.4 fL (ref 78.0–100.0)
MCV: 93.5 fL (ref 78.0–100.0)
Platelets: 134 10*3/uL — ABNORMAL LOW (ref 150–400)
Platelets: 170 10*3/uL (ref 150–400)
Platelets: 180 10*3/uL (ref 150–400)
RBC: 2.89 MIL/uL — ABNORMAL LOW (ref 4.22–5.81)
RBC: 3.3 MIL/uL — ABNORMAL LOW (ref 4.22–5.81)
RBC: 4.72 MIL/uL (ref 4.22–5.81)
RDW: 13 % (ref 11.5–15.5)
RDW: 13.1 % (ref 11.5–15.5)
RDW: 13.1 % (ref 11.5–15.5)
WBC: 10.5 10*3/uL (ref 4.0–10.5)
WBC: 11.9 10*3/uL — ABNORMAL HIGH (ref 4.0–10.5)
WBC: 6.4 10*3/uL (ref 4.0–10.5)

## 2010-05-05 LAB — CREATININE, SERUM
Creatinine, Ser: 1.25 mg/dL (ref 0.4–1.5)
GFR calc Af Amer: >60 mL/min (ref >60)
GFR calc non Af Amer: 58 mL/min — ABNORMAL LOW (ref >60)

## 2010-05-05 LAB — URINALYSIS, ROUTINE W REFLEX MICROSCOPIC
Bilirubin Urine: NEGATIVE
Glucose, UA: NEGATIVE mg/dL
Hgb urine dipstick: NEGATIVE
Ketones, ur: NEGATIVE mg/dL
Nitrite: NEGATIVE
Protein, ur: NEGATIVE mg/dL
Specific Gravity, Urine: 1.011 (ref 1.005–1.030)
Urobilinogen, UA: 1 mg/dL (ref 0.0–1.0)
pH: 6 (ref 5.0–8.0)

## 2010-05-05 LAB — POCT I-STAT 3, VENOUS BLOOD GAS (G3P V)
Acid-base deficit: 1 mmol/L (ref 0.0–2.0)
Bicarbonate: 25.5 mEq/L — ABNORMAL HIGH (ref 20.0–24.0)
O2 Saturation: 76 %
TCO2: 27 mmol/L (ref 0–100)
pCO2, Ven: 50.2 mmHg — ABNORMAL HIGH (ref 45.0–50.0)
pH, Ven: 7.314 — ABNORMAL HIGH (ref 7.250–7.300)
pO2, Ven: 45 mmHg (ref 30.0–45.0)

## 2010-05-05 LAB — COMPREHENSIVE METABOLIC PANEL
ALT: 27 U/L (ref 0–53)
AST: 28 U/L (ref 0–37)
Albumin: 4 g/dL (ref 3.5–5.2)
Alkaline Phosphatase: 64 U/L (ref 39–117)
BUN: 15 mg/dL (ref 6–23)
CO2: 19 mEq/L (ref 19–32)
Calcium: 9.2 mg/dL (ref 8.4–10.5)
Chloride: 105 mEq/L (ref 96–112)
Creatinine, Ser: 1.16 mg/dL (ref 0.4–1.5)
GFR calc Af Amer: >60 mL/min (ref >60)
GFR calc non Af Amer: >60 mL/min (ref >60)
Glucose, Bld: 142 mg/dL — ABNORMAL HIGH (ref 70–99)
Potassium: 4.3 mEq/L (ref 3.5–5.1)
Sodium: 134 mEq/L — ABNORMAL LOW (ref 135–145)
Total Bilirubin: 1 mg/dL (ref 0.3–1.2)
Total Protein: 6.4 g/dL (ref 6.0–8.3)

## 2010-05-05 LAB — PROTIME-INR
INR: 0.97 (ref 0.00–1.49)
INR: 1.51 — ABNORMAL HIGH (ref 0.00–1.49)
Prothrombin Time: 12.8 seconds (ref 11.6–15.2)
Prothrombin Time: 18.1 seconds — ABNORMAL HIGH (ref 11.6–15.2)

## 2010-05-05 LAB — POCT I-STAT, CHEM 8
BUN: 15 mg/dL (ref 6–23)
Calcium, Ion: 1.3 mmol/L (ref 1.12–1.32)
Chloride: 105 mEq/L (ref 96–112)
Creatinine, Ser: 1.2 mg/dL (ref 0.4–1.5)
Glucose, Bld: 170 mg/dL — ABNORMAL HIGH (ref 70–99)
HCT: 33 % — ABNORMAL LOW (ref 39.0–52.0)
Hemoglobin: 11.2 g/dL — ABNORMAL LOW (ref 13.0–17.0)
Potassium: 4.7 mEq/L (ref 3.5–5.1)
Sodium: 136 mEq/L (ref 135–145)
TCO2: 21 mmol/L (ref 0–100)

## 2010-05-05 LAB — ABO/RH: ABO/RH(D): A POS

## 2010-05-05 LAB — TYPE AND SCREEN
ABO/RH(D): A POS
Antibody Screen: NEGATIVE

## 2010-05-05 LAB — APTT
aPTT: 28 seconds (ref 24–37)
aPTT: 32 seconds (ref 24–37)

## 2010-05-05 LAB — HEMOGLOBIN A1C
Hgb A1c MFr Bld: 6.7 % — ABNORMAL HIGH (ref 4.6–6.1)
Mean Plasma Glucose: 146 mg/dL

## 2010-05-05 LAB — MRSA PCR SCREENING: MRSA by PCR: NEGATIVE

## 2010-05-05 LAB — PLATELET COUNT: Platelets: 107 10*3/uL — ABNORMAL LOW (ref 150–400)

## 2010-05-05 LAB — HEMOGLOBIN AND HEMATOCRIT, BLOOD
HCT: 27.4 % — ABNORMAL LOW (ref 39.0–52.0)
Hemoglobin: 9.6 g/dL — ABNORMAL LOW (ref 13.0–17.0)

## 2010-05-06 LAB — POCT I-STAT 3, VENOUS BLOOD GAS (G3P V)
Acid-Base Excess: 2 mmol/L (ref 0.0–2.0)
Bicarbonate: 26.6 mEq/L — ABNORMAL HIGH (ref 20.0–24.0)
TCO2: 28 mmol/L (ref 0–100)

## 2010-05-06 LAB — BASIC METABOLIC PANEL
BUN: 17 mg/dL (ref 6–23)
CO2: 28 mEq/L (ref 19–32)
Calcium: 9.4 mg/dL (ref 8.4–10.5)
Chloride: 104 mEq/L (ref 96–112)
Creatinine, Ser: 1.27 mg/dL (ref 0.4–1.5)
GFR calc Af Amer: >60 mL/min (ref >60)

## 2010-05-06 LAB — POCT I-STAT 3, ART BLOOD GAS (G3+)
Acid-Base Excess: 1 mmol/L (ref 0.0–2.0)
Bicarbonate: 24.9 mEq/L — ABNORMAL HIGH (ref 20.0–24.0)
O2 Saturation: 96 %
TCO2: 26 mmol/L (ref 0–100)

## 2010-06-16 ENCOUNTER — Other Ambulatory Visit: Payer: Self-pay | Admitting: Cardiothoracic Surgery

## 2010-06-16 DIAGNOSIS — I359 Nonrheumatic aortic valve disorder, unspecified: Secondary | ICD-10-CM

## 2010-06-16 NOTE — Assessment & Plan Note (Signed)
OFFICE VISIT   Travis [name], Travis [name]  DOB:  04/25/1945                                        January 10, 2009  CHART #:  [id number]   CURRENT PROBLEMS:  1. Critical aortic stenosis with a calculated valve area of 0.6.  2. Obesity.  3. Sleep apnea.  4. Non-insulin-dependent diabetes mellitus.   ALLERGIES:  Penicillin.   PRESENT ILLNESS:  The patient returns for final discussion prior to his  scheduled aortic valve replacement for severe aortic stenosis on  January 15, 2009.  On his initial consultation, a necrotic left  maxillary tooth was noted and he has since been treated by his local  physician and a dental extraction has been performed with a preoperative  clindamycin prophylaxis.  He has had no bleeding complications.  He  remains on his medications as stated in the original consultation and he  still has symptoms of dyspnea on exertion and chest pain on exertion.  We discussed the procedure of aortic valve replacement in detail on his  initial consultation and he has expressed a choice for a bioprosthetic  valve to avoid lifelong commitment to Coumadin anticoagulation therapy.   PHYSICAL EXAMINATION:  Vital Signs:  Blood pressure 106/65, pulse 92 and  regular, respirations 18, and saturation 95%.  General:  He is alert and  pleasant.  Lungs:  Breath sounds are clear.  ENT:  His ENT exam notes  that his left maxillary tooth has been extracted.  He has a right  maxillary tooth that has a filling, partially exposed.  This is not  tender and was checked with an x-ray by his dentist.  Cardiac:  He has a  stable grade 3/6 systolic ejection murmur of aortic stenosis.  Extremities:  He has mild ankle edema.   LABORATORY DATA:  His preoperative Dopplers are pending.   PLAN:  The patient will be scheduled for aortic valve replacement using  a bioprosthetic valve on Wednesday, January 15, 2009 for his severe  aortic stenosis.  The procedure was again  reviewed in detail with the  patient as well as his wife, was present for the office visit.  He  understands the benefits, expected hospitalization, potential risks, and  alternatives and agrees to proceed with surgery.   Kerin Perna, M.D.  Electronically Signed   PV/MEDQ  D:  01/10/2009  T:  01/11/2009  Job:  [id number]

## 2010-06-16 NOTE — Assessment & Plan Note (Signed)
OFFICE VISIT   KALEB, SEK  DOB:  [birth year]                                        March 26, 2009  CHART #:  [id number]   CURRENT PROBLEMS:  1. 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  2. Obesity and diabetes.  3. Hypertension and fluid retention.   PRESENT ILLNESS:  The patient is a 65 year old diabetic gentleman who  returns for further evaluation of his sternal malunion after AVR.  He is  able to walk 2-3 miles a day and he has some intermittent discomfort,  for which he takes hydrocodone.  The sternal incision is well healed  without infection.  Unfortunately, he had some severe coughing and with  his short obese body habitus, he displaced the lower 2 sternal wires in  early postoperative recovery.  At this point, he has severe hypertension  and fluid retention as well.  His chest x-ray shows clear lung fields,  however and his exercise tolerance and overall strength have  significantly improved since his aortic valve replacement as he had  severe aortic stenosis.   PHYSICAL EXAMINATION:  Vital Signs:  Blood pressure 175/95, pulse 18,  saturation 98%, weight 250 pounds.  Chest:  Breath sounds are clear.  Cardiac:  Slight flow murmurs through the prosthetic valve.  There is no  diastolic murmur of AI.  Sternum is well healed.  I cannot elicit any  instability.  He has 2+ bilateral edema up to the midtibia level.   His chest x-ray shows the displaced lower sternal wires, otherwise the  wires look intact, and there is no significant pleural effusion.   PLAN:  We will increase his antihypertensive medication to resume his  preoperative meds of Avalide (325 mg) daily, Tekturna one-half tablet  (150 mg) daily and increase his Toprol-XL from 50 mg to 100 mg daily.  We will also give him a course of Lasix 40 mg a day with 20 of potassium  for the next 20 days and 7 days of Zaroxolyn 5 mg p.o. daily.  Plan on  seeing the patient back in approximately 3 weeks.   As far as his sternal malunion, he will probably need to be rewired.  However, prior to surgery, he will need to lose some weight, and it  would be optimal to avoid operating until 4-5 months postop to reduce  some of the postoperative inflammatory reaction.  This was discussed  with the patient and he understands and agrees.   Kerin Perna, M.D.  Electronically Signed   PV/MEDQ  D:  03/26/2009  T:  03/27/2009  Job:  [id number]   cc:   Nanetta Batty, M.D.

## 2010-06-16 NOTE — Assessment & Plan Note (Signed)
OFFICE VISIT   [name], [name]  DOB:  1945/08/29                                        February 17, 2009  CHART #:  [id number]   REASON FOR OFFICE VISIT:  Routine followup status post AVR.   HISTORY OF PRESENT ILLNESS:  This is a 66 year old Caucasian male who is  found to have a bicuspid aortic valve and severe aortic stenosis.  He  underwent an aortic valve replacement on January 15, 2009.  His  postoperative course was fairly uneventful except he did develop a mild  postoperative ileus, postoperative atrial fibrillation, and had mild  postoperative acute renal insufficiency.  Since having been discharged  from the hospital, the patient states his only complaint is a clicking  sensation he feels mostly on the left side of his sternal wound with  some right-sided incisional pain as well as some bilateral lower  extremity edema.  The patient denies any fever, chills, or shortness of  breath.   PHYSICAL EXAMINATION:  General:  This is a pleasant 65 year old  Caucasian male who is in no acute distress who is alert, oriented, and  cooperative.  Vital Signs:  Latest vital signs are as follows, BP  130/60, heart rate 104, respirations 18, and O2 sat 97% on room air.  Cardiovascular:  Regular rate and rhythm.  Pulmonary:  Clear to  auscultation bilaterally.  No rales, wheezes, or rhonchi.  Abdomen:  Soft, obese, and nontender.  Bowel sounds present.  Extremities:  2+  bilateral lower extremity edema.  Chest:  Sternal wound is clean, dry,  and well healed.  No signs of infection.  There is no obvious sternal  instability.  There is some crepitus on the left side midway down the  sternal wound.   Chest x-ray done today showed improvement in lung aeration and no  pneumothorax.   IMPRESSION AND PLAN:  1. Cardiovascular.  The patient remains in sinus rhythm.  I am going      to decrease his amiodarone to 100 mg p.o. daily.  He is to continue      on his Coumadin  as directed.  His next PT and INR to be drawn on      Wednesday.  In addition, he will likely have to be restarted on a      beta-blocker as his blood pressure and heart rate were beginning to      increase.  He has had a followup appointment to see the      cardiologist next Thursday at 10:30 a.m. and he can address further      changes of his medications at that time.  2. Regarding the patient's bilateral lower extremity edema, the      patient is a given a prescription for Lasix 40 mg p.o. daily and      KCl 20 mEq p.o. daily.  He was instructed to keep his lower      extremities elevated above his heart while resting.  3. The patient instructed to maintain sternal precautions, no lifting      more than 10-15 pounds for the next couple of weeks.  He will      return to see Dr. Donata [name] in followup on February 26, 2009.  4. The patient instructed to walk every day.  He can slowly begin  cardiac rehab although he was cautioned about using any weight as      we will have to reassess the sternum to make sure it has continued      to heal properly.   Travis [name], M.D.  Electronically Signed   DZ/MEDQ  D:  02/17/2009  T:  02/18/2009  Job:  [id number]   cc:   Travis [name], M.D.

## 2010-06-16 NOTE — Assessment & Plan Note (Signed)
OFFICE VISIT   Travis [name], GEERTS  DOB:  [birth year]                                        May 28, 2009  CHART #:  [id number]   CURRENT PROBLEMS:  1. Status post sternal rewiring for fibrous malunion, April 21, 2009.  2. Status post aortic valve replacement for aortic stenosis, December      2010.  3. Obesity.  4. Diabetes.  5. Severe upper respiratory infection - allergic reaction to SEASONAL      ALLERGENS.   PRESENT ILLNESS:  The patient returns for a wound check.  The sternal  incision is healing well; however, he has had persistent coughing,  productive cough of tannish material, wheezing, and upper airway and  sinus congestion.  The Allegra helped as has Mucinex, but he is still  having considerable trouble breathing and coughing.  The sternal  incision fortunately has held up to this and is healing properly.  He  has no symptoms of CHF or fluid retention.   PHYSICAL EXAMINATION:  Blood pressure 140/80, pulse 80, and saturation  96%.  He is afebrile.  The sternum is stable and well healed.  Breath  sounds are coarse with scattered wheezing and rhonchi.  There is no  peripheral edema.  Cardiac rhythm is regular and the valve sounds fine.   IMPRESSION AND PLAN:  The patient has severe upper respiratory infection  and may have severe allergic reaction to the high POLLEN.  I will give  him a short prednisone taper and a Z-Pak.  He will continue with his  Mucinex and Allegra.  I plan on seeing him back for wound check in 6  weeks.   Kerin Perna, M.D.  Electronically Signed   PV/MEDQ  D:  05/28/2009  T:  05/29/2009  Job:  [id number]

## 2010-06-16 NOTE — Assessment & Plan Note (Signed)
OFFICE VISIT   Travis, [name]  DOB:  Aug 03, 1945                                        February 26, 2009  CHART #:  [id number]   CURRENT PROBLEMS:  1. Status post aortic valve replacement with a 23-mm pericardial      valve, January 15, 2009 for severe aortic stenosis.  2. Obesity and diabetes.  3. Sternal incisional pain and perceived instability, possible fibrous      malunion.   PRESENT ILLNESS:  The patient is a 65 year old Caucasian, diabetic  gentleman who returns to the office for a followup after AVR in mid  December.  He is having some incisional pain and some perceived popping  and instability.  He denies lifting any heavy weights.  He is walking  and he has no symptoms of CHF.  The incision appears to have healed  properly without evidence of infection.  He has not lost any weight  after surgery.  He had some perioperative SVT and AFib and is finishing  a course of oral amiodarone and Coumadin.  He has also recently finished  a course of Lasix and potassium for some pedal edema.   PHYSICAL EXAMINATION:  Blood pressure 148/81, pulse 100, respirations  18, and saturation 92% on room air.  General appearance is that of a  middle-aged obese male in no distress.  Breath sounds are clear.  Cardiac rhythm is regular and the aortic prosthesis sounds normal.  The  sternal incision is without signs of infection.  I feel no instability  on exam with pressure with him twisting his torso.   A PA and lateral chest x-ray taken last week shows possible migration of  the lower most sternal wire.   IMPRESSION AND PLAN:  The patient has sternal pain and possible fibrous  malunion.  I detect no significant instability on exam, however.  My  plan is to continue to follow this and to caution him from lifting  anything further and to hopefully lose some weight.  I believe there is  a high likelihood that with time.  The sternal incision will form a more  solid  scar and his pain will improve and resolve ultimately.  I will  have him return with a CT scan of the chest without contrast to evaluate  the sternal closure in mid February.   Travis [name], M.D.  Electronically Signed   PV/MEDQ  D:  02/26/2009  T:  02/26/2009  Job:  [id number]   cc:   Travis [name], M.D.

## 2010-06-16 NOTE — Consult Note (Signed)
NEW PATIENT CONSULTATION   Travis [name], Travis [name]  DOB:  1945/11/12                                        January 01, 2009  CHART #:  [id number]   REASON FOR CONSULTATION:  Severe aortic stenosis.   CHIEF COMPLAINT:  Chest pain on exertion, cardiac murmur.   HISTORY OF PRESENT ILLNESS:  I was asked to evaluate this 65 year old  Caucasian obese diabetic for evaluation and treatment of a recently  diagnosed severe aortic stenosis.  The patient has had a murmur of  aortic stenosis followed with serial 2-D echoes over the past 3 years.  Recently, he developed exertional chest pain, the first episode occurred  in October when he was at the [facility] and had to walk a  slight hill and developed chest pain and needed to sit and rest after  which the pain resolved.  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  He denies  any resting symptoms.  He underwent a stress test that showed no  ischemic changes; however, he did develop chest pain during the stress.  His EF at rest was 55% and dropped to 40% at peak exercise.  He  underwent a cardiac catheterization subsequently which demonstrated  clean coronaries.  A ventriculogram was not performed.  PA pressures  were fairly normal, measuring 35/10 with a cardiac output of 4.2 L per  minute.  An office 2-D echo was performed, the report of which  demonstrates LVH with EF of 55%, mild MR, severe AS with a peak gradient  of 73 mmHg and a mean gradient of 48 mmHg with a calculated valve area  of 0.5-0.6.  Based on the cath and echo, he was felt to be candidate for  aortic valve replacement.   The patient denies history of rheumatic heart disease as an adolescent.  He denies long history of a cardiac murmur.  There is no familial  history of aortic stenosis or bicuspid aortic valve.  The studies  performed to date have shown no evidence of enlarged aortic root or  dilatation of the ascending aorta.   PAST MEDICAL HISTORY:  1. Hypertension.  2. Obesity.  3. Non-insulin diabetes.  4. Severe aortic stenosis with normal LV function.  5. Hyperlipidemia.   HOME MEDICATIONS:  1. Vytorin 10/20.  2. Metoprolol succinate 200 mg daily.  3. Tekturna 150 mg daily.  4. Avalide 300/25 mg daily.  5. Aspirin 81 mg daily.  6. Glimepiride 1 mg daily.   ALLERGIES:  Penicillin, Norvasc, and Prinivil.   SOCIAL HISTORY:  He is a Engineer, petroleum, running his own business.  He  is married.  He has no tobacco history and uses alcohol minimally.   FAMILY HISTORY:  Positive for diabetes.  One brother has had coronary  disease and had an MI before age 30.  His brother had type 1 diabetes.   REVIEW OF SYSTEMS:  CONSTITUTIONAL:  Negative fever or weight loss.  He  has slowly been gaining weight.  He is approximately 250 pounds.  HEENT:  Positive for a necrotic left maxillary tooth that he is going to  see his dentist about and extraction before elective AVR.  He denies  difficulty swallowing.  THORACIC:  Negative for history of chest trauma, abnormal chest x-ray,  productive cough, or recent URI.  CARDIAC:  Positive for his cardiac murmur, aortic stenosis, and LVH with  exertional chest pain.  GI:  Negative for hepatitis, jaundice, or blood per rectum.  NEUROLOGIC:  Negative for kidney stones or BPH.  VASCULAR:  Negative for DVT, TIA, or varicose veins.  He does have some  symptoms of claudication.  He also has recent onset of increasing edema  of his ankles.  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  He has arthritis and received a steroid injection in his  right knee after a partial meniscal tear.  He has generalized diffuse  arthritis.  HEMATOLOGIC:  Positive for easy bruising but no real bleeding diathesis.  NEUROLOGIC:  Negative stroke or seizure.   PHYSICAL EXAMINATION:  Vital Signs:  The patient is 5 feet 7  inches,  weighs 250 pounds.  Blood pressure 123/84, pulse 88 and regular,  respirations 20, saturation 96%.  General:  Appearance is that of an  obese middle-aged male in no acute distress.  HEENT:  Normocephalic.  Pupils are equal.  Dentition is poor with a necrotic left maxillary  tooth.  Neck:  Without JVD, mass, or carotid bruit.  He does have  transmitted cardiac murmur in his neck.  Lymphatics:  No palpable  supraclavicular or cervical adenopathy.  Lungs:  Breath sounds are  diminished but clear.  Cardiac:  A grade 3/6 systolic ejection murmur  without S3 gallop or diastolic rumble.  Abdomen:  Obese, nonpalpable  mass or tenderness.  Extremities:  Ankle edema 2+.  He has some atrophic  skin changes of his distal legs.  He has palpable pulses in his wrists  and neck and femoral, but I feel no palpable pedal pulses.  He has some  scarring around the left lateral knee as part of his Osgood-Schlatter  disease as a child.  Neurologic:  Alert and oriented without focal motor  deficit.   LABORATORY DATA:  I reviewed the cardiac cath, his chest x-ray, and his  echo report.  He has severe aortic stenosis with valve area of 0.6 by  cath and 0.55 by echo.   ASSESSMENT AND PLAN:  He would benefit from aortic valve replacement but  he needs to get his necrotic teeth extracted.  He will use his personal  dentist and return back to see me in the office in 9 days to schedule  the date of surgery which was tentatively set for January 15, 2009.  I  explained to him in length the details, benefits, and risks of aortic  valve replacement for treatment of aortic stenosis.  I reviewed the  choice between a bioprosthetic valve and a mechanical valve and at age  20 we could go either way.  He is leaning toward a bioprosthetic valve,  so will not need a lifelong commitment to Coumadin.  He will return in 9  days for follow up.   Kerin Perna, M.D.  Electronically Signed   PV/MEDQ  D:  01/01/2009   T:  01/02/2009  Job:  [id number]   cc:   Nanetta Batty, M.D.

## 2010-06-16 NOTE — Assessment & Plan Note (Signed)
OFFICE VISIT   NAWAF, STRANGE  DOB:  [birth year]                                        May 07, 2009  CHART #:  [id number]   CURRENT PROBLEMS:  1. Status post aortic valve replacement for aortic stenosis, December      2010.  2. Postoperative sternal malunion requiring sternal rewiring on April 21, 2009.  3. Diabetes.  4. Obesity.   PRESENT ILLNESS:  The patient is a 65 year old Caucasian male returns  for his first office visit after undergoing sternal rewiring  approximately 3 weeks ago for a fibrous malunion.  He has done well but  has still fairly significant chest wall incisional pain.  He is walking.  He has felt no more instability or shoulder pain.  He has not been  coughing and he is a nonsmoker.  His diabetic sugars have been fairly  well controlled.  He denies angina or symptoms of CHF.  There has been  no sternal drainage.   His current medications include Avalide, metoprolol, Tekturna, Vytorin,  glimepiride, and Toprol-XL 100 mg daily.   PHYSICAL EXAM:  Blood pressure 145/80, pulse 72 and regular,  respirations 18, saturation 93% on room air.  He is alert and pleasant.  Breath sounds are clear and equal.  The sternum is well healed and very  stable.  Cardiac:  Rhythm is regular in the aortic bioprosthetic valve  and has no evidence of AI.  There is minimal pedal edema and good distal  pulses.   PA and lateral chest x-ray reveals clear lung fields.  The sternal wires  are all intact and well aligned and mediastinum is stable.   PLAN:  The patient was told he could resume driving and light activities  and not to lift more than 10 pounds.  He is provided with a prescription  for OxyIR as well as Flexeril to be taken at night as well as Allegra  due to his sneezing and congestion with the seasonal allergies.  I will  plan on seeing him back in 2-3 weeks with followup exam of the sternum  to discuss further advancing activity  levels.   Kerin Perna, M.D.  Electronically Signed   PV/MEDQ  D:  05/07/2009  T:  05/08/2009  Job:  [id number]

## 2010-06-16 NOTE — Assessment & Plan Note (Signed)
OFFICE VISIT   Travis [name], Travis [name]  DOB:  [birth year]                                        February 17, 2009  CHART #:  [id number]   ADDENDUM   After further review and discussion with Dr. Donata Clay, it was decided  to put the patient back on a beta-blocker.  His blood pressure and heart  rate are beginning to increase, and he was called in a script for Toprol-  XL 25 mg p.o. daily.  I left a message at the patient's home with the  pharmacy number and to pick this medicine up and to begin it.   Doree Fudge, PA   DZ/MEDQ  D:  02/17/2009  T:  02/18/2009  Job:  [id number]   cc:   Dr. Silvio Clayman, M.D.

## 2010-06-16 NOTE — H&P (Signed)
HISTORY AND PHYSICAL EXAMINATION   April 09, 2009   Re:  Travis [name], Travis [name]           DOB:  1945-05-15   ADMISSION DIAGNOSES:  1. 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  2. Diabetes mellitus and obesity.  3. Allergy to penicillin.   PRESENT ILLNESS:  The patient is a 65 year old Caucasian male, diabetic,  who returns for a sternal wound check.  Following surgery, he has had  progressive problems with sternal malunion, pain, popping sensation, and  decrease in exercise tolerance.  His chest x-ray showed the lower 2  wires have been migrated and displaced laterally without fracture.  A  full CT scan of his sternum showed a fibrous malunion with some sternal  separation and some displacement of the 2 lower sternal wires.  There is  no significant pericardial effusion, pleural effusion, or lung airspace  disease.  He had a pericardial tissue valve for aortic stenosis, which  was severe (21-mm Magna Ease).  His coronaries were normal.  His LV  function was normal at the time of surgery.  He has maintained a sinus  rhythm, but he has had some problems of fluid retention postoperatively.  He weighs approximately 250 pounds and his height is 5 feet 7 inches.   PAST MEDICAL HISTORY:  1. Diabetes mellitus.  2. Hypertension.  3. Obesity.  4. Hyperlipidemia.  5. 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   HOME MEDICATIONS:  1. Glimepiride 1 mg daily.  2. Aspirin 81 mg daily.  3. Avalide 300/25 one daily.  4. Tekturna 150 mg daily.  5. Toprol-XL 100 mg daily.  6. Vytorin 10/20 one p.o. daily.  7. Lasix 40 mg p.o. daily.  8. Potassium 20 mEq p.o. daily.   ALLERGIES:  Penicillin, Norvasc, and Prinivil.   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  He  is married.  He has no tobacco intake or alcohol intake.   FAMILY HISTORY:  Positive for diabetes.  Positive for coronary  disease  and MI.   REVIEW OF SYSTEMS:  GENERAL:  He did well following his first operation.  He did have some mild postoperative issues with renal insufficiency,  atrial fibrillation, sleep apnea, oxygen dependence, and cough.  His  chest x-rays in the immediate postoperative period showed no sternal  wire displacement or sternal issues.  On his first followup office  visit, his symptom which had progressed and become significant.  He  finished taking his amiodarone and has maintained sinus rhythm without  Coumadin.  CONSTITUTIONAL:  Negative for recent fever or weight loss, even though  is trying.  ENT:  Negative for sore throat, hoarseness, or difficulty swallowing.  THORACIC:  Positive for the sternal wound issue.  There has never been  an infection, just the wires pulled through and the sternal edges have  displaced 4-5 mm.  GI:  Negative for gallstones, hepatitis, or jaundice.  No GI bleeding.  EXTREMITIES:  Evidence of edema, which is improved with Lasix and  Zaroxolyn, back to baseline.  NEUROLOGIC:  Negative for neurologic events.   PHYSICAL EXAMINATION:  General:  The patient is 5 feet 7 inches and  weighs 250 pounds.  Vital Signs:  Blood pressure 130/70, pulse 90,  respirations 18, and saturation 95%.  HEENT:  Normocephalic.  Neck:  Without JVD, mass, or bruit.  Lymphatics:  No palpable adenopathy in  neck.  Lungs:  Breath sounds are clear and equal.  The sternal incision  is well healed.  It is difficult to elicit any instability, but when he  twists his thorax and coughs heavily, there is a small amount of  instability.  Abdomen:  Very obese.  Cardiac:  Regular rhythm without  murmur or gallop or friction rub.  Extremities:  Warm with pulses and  there is no significant edema, tenderness, or cyanosis.  Neurologic:  Intact.   LABORATORY DATA:  I reviewed the most recent CT scan, which shows the  sternal malunion.  There is no evidence of infection.   PLAN:  The patient  will be prepared for sternal rewiring on Monday,  April 21, 2009 at [facility].  I have discussed the indications,  benefits, and risks with the patient and he understands  and agrees.  Prior to surgery, we will obtain a 2-D echo to assess for  pericardial effusion and to assess the function of his aortic valve.   Kerin Perna, M.D.  Electronically Signed   PV/MEDQ  D:  04/09/2009  T:  04/10/2009  Job:  [id number]   cc:   Nanetta Batty, MD

## 2010-06-16 NOTE — Assessment & Plan Note (Signed)
OFFICE VISIT   Travis, [name]  DOB:  May 20, 1945                                        August 06, 2009  CHART #:  [id number]   HISTORY OF PRESENTING ILLNESS:  This is a 65 year old Caucasian male who  is status post rewiring for fibrous nonunion on April 21, 2009, as well  as status post aortic valve replacement (for aortic stenosis in December  2010).  The patient was last seen in the office on May 28, 2009.  At  that time, he had complaints of persistent coughing, wheezing, and upper  airway sinus congestion.  It is felt that he may have an upper  respiratory infection as well as a very severe allergic reaction to  POLLEN.  He was given a short prednisone taper and a Z-Pak and was  instructed to continue the Mucinex and the Allegra..  The patient  reports resolution of these symptoms.  His current complaints include an  occasional episode of dizziness as well as sternal incisional pain, and  increased swelling of his right lower extremity and foot.  He denies any  chest pain or shortness of breath, fever, or chills.   PHYSICAL EXAMINATION:  General:  This a pleasant 65 year old Caucasian  male who is in no acute distress who is alert, oriented, and  cooperative.  Vital Signs:  BP 131/77, heart rate 90, O2 sat 92% on room  air, respirations 18.  Cardiovascular:  Regular rate and rhythm.  No  murmur.  Pulmonary:  Clear to auscultation bilaterally.  No rales,  wheezes, or rhonchi.  Sternal wound is well healed.  Sternum is stable.  Extremities:  Trace pedal edema, chronic venous stasis changes  bilaterally.   DIAGNOSTIC TESTS:  Chest x-ray done today reveals minimal hilar and  basilar atelectasis and volume loss.  No pneumothorax.  Stable  appearance of sternotomy wires.   IMPRESSION AND PLAN:  The patient was seen and evaluated by Dr. Donata Clay.  The patient was instructed he may begin driving short distances  and increase his frequency and duration as  tolerates.  Regarding the  patient's right lower extremity pain and previous increased edema, he  thought he may have had gout.  He is to see his medical doctor if he has  any further problems with this.  He does have an appointment to see Dr.  Allyson Sabal on August 07, 2009.  He will return to see Dr. Donata Clay in November  with a chest x-ray.  He is to contact our office in the interim if he  has any questions, problems, or concerns.  In addition, the patient was  encouraged to increase his physical activity as it is important for him  to have weight reduction.  He was instructed not to lift anything more  than 10 pounds.   Kerin Perna, M.D.  Electronically Signed   DZ/MEDQ  D:  08/06/2009  T:  08/07/2009  Job:  [id number]   cc:   Nanetta Batty, M.D.

## 2010-06-16 NOTE — Assessment & Plan Note (Signed)
OFFICE VISIT   Travis [name], Travis [name]  DOB:  1945/05/26                                        December 17, 2009  CHART #:  [id number]   CURRENT PROBLEMS:  1. Status post sternal rewiring for fibrous malunion after aortic      valve replacement, December 2010.  2. Obesity and hypertension.  3. A 3 cm epigastric hernia.   PRESENT ILLNESS:  The patient returns for a wound check.  His sternum is  rewired in March over 6 months ago for a fibrous malunion after prior  sternotomy for AVR.  He still has some atypical chest wall pain, but the  sternum overall is stable and well healed.  He does have an epigastric  hernia approximately 3 cm, which has been stable.  He is obese with a  weight of 246 pounds.  He is followed carefully by Dr. Nanetta Batty  for his blood pressure and lower extremity edema issues.  He has a  bioprosthetic valve which is functioning normally and he has remained in  sinus rhythm.   PHYSICAL EXAMINATION:  Vital Signs:  Blood pressure 154/80, pulse 80 and  regular, respirations 18, saturation 97%, and weight 246 pounds.  General:  He is alert and pleasant.  Lungs:  Breath sounds are clear.  Cardiac:  Rhythm is regular.  There is a soft flow murmur through the  aortic prosthesis.  There is no diastolic murmur.  Chest:  The sternum  is stable and healed.  He has a small epigastric hernia measuring 2.5-3  fingerbreadths, which is nontender and non-firm.   DIAGNOSTIC TESTS:  A PA and lateral chest x-ray shows the sternal wires  to be all intact.  There is no pleural effusion and his cardiac  silhouette is stable.   IMPRESSION AND PLAN:  The patient is now over 6 months following sternal  rewiring and is doing well.  He was told he could resume doing some yard  work and lift up to 20 pounds which would include carrying a small leaf  blower unit.  He will remain on his medications as directed by Dr.  Allyson Sabal, and we will plan on seeing him back  year in approximately 6  months with a chest x-ray.  I would not recommend surgical intervention  on the epigastric hernia due to his obesity and his 2 prior sternal  incisions.  We will follow this however and recognize for now it is  asymptomatic and at low risk for incarceration.   Kerin Perna, M.D.  Electronically Signed   PV/MEDQ  D:  12/17/2009  T:  12/17/2009  Job:  [id number]   cc:   Nanetta Batty, M.D.

## 2010-06-17 ENCOUNTER — Ambulatory Visit: Payer: Self-pay | Admitting: Cardiothoracic Surgery

## 2010-06-18 ENCOUNTER — Ambulatory Visit
Admission: RE | Admit: 2010-06-18 | Discharge: 2010-06-18 | Disposition: A | Discharge status: Home / Self Care | Payer: Medicare Other | Source: Ambulatory Visit | Attending: Cardiothoracic Surgery | Admitting: Cardiothoracic Surgery

## 2010-06-18 ENCOUNTER — Ambulatory Visit (INDEPENDENT_AMBULATORY_CARE_PROVIDER_SITE_OTHER): Payer: Commercial Managed Care - PPO | Admitting: Cardiothoracic Surgery

## 2010-06-18 DIAGNOSIS — S2329XA Dislocation of other parts of thorax, initial encounter: Secondary | ICD-10-CM

## 2010-06-18 DIAGNOSIS — I359 Nonrheumatic aortic valve disorder, unspecified: Secondary | ICD-10-CM

## 2010-06-18 NOTE — Consult Note (Signed)
NEW PATIENT CONSULTATION  Travis [name], Travis [name] DOB:  23-Feb-1945                                        Jun 18, 2010 CHART #:  [id number]  CURRENT PROBLEMS: 1. Small epigastric hernia status post AVR and subsequent sternal     rewiring for fibrous malunion December 2010. 2. Obesity. 3. Hypertension.  PRESENT ILLNESS:  Travis [name] returns for 41-month followup of his epigastric hernia after undergoing sternal rewiring for fibrous malunion related to his obesity.  He has lost on to 230 pounds.  He denies any problems of the epigastric hernia.  He is followed by Dr. Nanetta Batty for his aortic valve which is functioning normally by recent 2-D echo. He denies symptoms of angina or CHF and he is active around the house in yard.  He is not taking any pain medications and he remains in sinus rhythm.  PAST MEDICAL HISTORY: 1. Diabetes mellitus. 2. Hypertension. 3. Dyslipidemia. 4. 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 5. Allergy to PENICILLIN.  PHYSICAL EXAMINATION:  Blood pressure 150/90, pulse 78, respirations 20, saturation 98%, weight 230 pounds.  He is alert, pleasant and oriented. Breath sounds are clear and equal.  The sternum is stable and well healed.  He has a defect just beneath the xiphoid which is approximately three finger length and width or 3 cm.  It is easily reducible.  His entire abdominal wall is somewhat weak.  His aortic valve sounds normal and his breath sounds are clear.  IMPRESSION AND PLAN:  No surgical repair of the epigastric hernia at this point.  Further weight loss would improve the overall situation.  I will see him back in 6 months.  Kerin Perna, M.D. Electronically Signed  PV/MEDQ  D:  06/18/2010  T:  06/18/2010  Job:  [id number]  cc:   Nanetta Batty, M.D.

## 2010-12-07 ENCOUNTER — Encounter: Payer: Self-pay | Admitting: Cardiothoracic Surgery

## 2010-12-16 ENCOUNTER — Encounter: Payer: Self-pay | Admitting: Cardiothoracic Surgery

## 2010-12-16 ENCOUNTER — Ambulatory Visit (INDEPENDENT_AMBULATORY_CARE_PROVIDER_SITE_OTHER): Payer: Commercial Managed Care - PPO | Admitting: Cardiothoracic Surgery

## 2010-12-16 VITALS — BP 113/72 | HR 76 | Resp 20 | Ht 69.0 in | Wt 214.0 lb

## 2010-12-16 DIAGNOSIS — Z952 Presence of prosthetic heart valve: Secondary | ICD-10-CM

## 2010-12-16 DIAGNOSIS — Z954 Presence of other heart-valve replacement: Secondary | ICD-10-CM

## 2010-12-16 DIAGNOSIS — I359 Nonrheumatic aortic valve disorder, unspecified: Secondary | ICD-10-CM

## 2010-12-16 NOTE — Progress Notes (Signed)
HPI: 1 status post sternal rewiring March 2011 2 epigastric incisional hernia 2 finger breaths in with, stable 3 aortic valve replacement 23 mm magna  December 2010  Current Outpatient Prescriptions  Medication Sig Dispense Refill  . aspirin 81 MG tablet Take 81 mg by mouth daily.        Marland Kitchen ezetimibe-simvastatin (VYTORIN) 10-20 MG per tablet Take 1 tablet by mouth at bedtime.        . furosemide (LASIX) 40 MG tablet Take 40 mg by mouth 2 (two) times daily.        . insulin glargine (LANTUS) 100 UNIT/ML injection Inject 47 Units into the skin daily.        Marland Kitchen losartan-hydrochlorothiazide (HYZAAR) 100-25 MG per tablet Take 1 tablet by mouth daily.        . metoprolol (TOPROL-XL) 100 MG 24 hr tablet Take 100 mg by mouth daily.        . potassium chloride (KLOR-CON) 20 MEQ packet Take 20 mEq by mouth 2 (two) times daily.           Physical Exam: Normal sinus rhythm blood pressure stable oxygen saturation greater than 95% Breath sounds clear and equal Sternum stable, resolved fibrous malunion Stable epigastric incisional hernia 2 finger breaths in with, nontender  Diagnostic Tests: Non-  Impression: Stable status post sternal rewiring, residual epigastric hernia. I would not recommend surgical correction of the hernia as he is proximally 100 pounds overweight  Plan: Continued followup, weight loss encourage, no new prescriptions provided. Return in 3 months with chest x-ray to follow epigastric hernia

## 2010-12-16 NOTE — Patient Instructions (Signed)
Max lifting limit 20lbs

## 2011-02-15 IMAGING — CR DG CHEST 1V PORT
1 series · 1 of 1 positions shown · non-contrast
Comparison: 01/13/2009 study

CLINICAL DATA: History of aortic stenosis and coronary artery
bypass grafting.

PORTABLE CHEST - 1 VIEW

[AP]
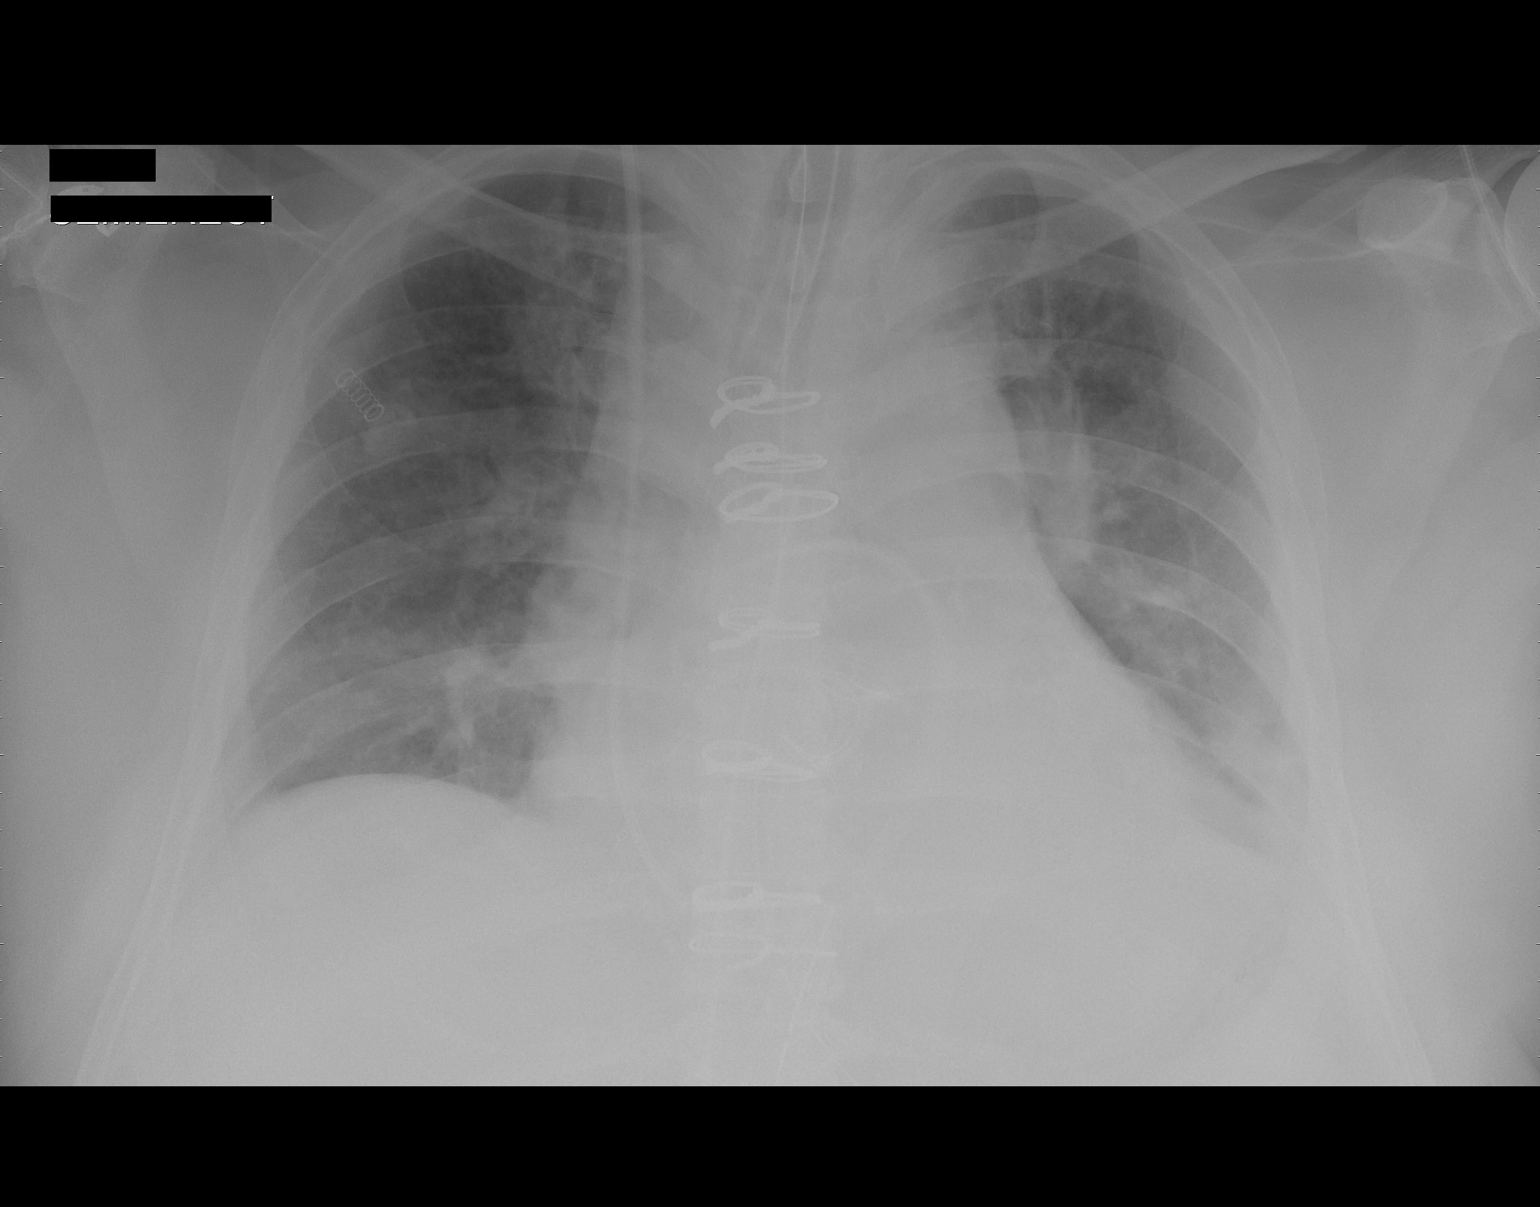

[1 of 1 positions shown; findings below may reference images not displayed]

FINDINGS: Endotracheal tube is in place with its tip 3.5 cm above
the carina.  Enteric tube is in place with distal portion entering
stomach.  Tip is not included on the examination.  Mediastinal
drain is seen in place.  Right internal jugular Swan-Ganz catheter
is in place with tip in right pulmonary artery.  No pneumothorax is
evident. The patient has undergone previous median sternotomy and
coronary artery bypass grafting.  There is moderate enlargement of
the cardiac silhouette and mediastinum.  There is minimal
atelectasis in the right medial base.  There is atelectasis and
patchy hazy pulmonary density in the inferior left lung.
IMPRESSION: Endotracheal tube is in place with its tip 3.5 cm above the carina.
Enteric tube is in place with distal portion entering stomach.  Tip
is not included on the examination.  Mediastinal drain is seen in
place.  Right internal jugular Swan-Ganz catheter is in place with
tip in right pulmonary artery.  No pneumothorax is evident. There
is moderate enlargement of the cardiac silhouette and mediastinum.
There is minimal atelectasis in the right medial base.  There is
atelectasis and patchy hazy pulmonary density in the inferior left
lung.

## 2011-03-16 ENCOUNTER — Other Ambulatory Visit: Payer: Self-pay | Admitting: Thoracic Surgery

## 2011-03-16 DIAGNOSIS — S2329XA Dislocation of other parts of thorax, initial encounter: Secondary | ICD-10-CM

## 2011-03-17 ENCOUNTER — Ambulatory Visit: Payer: Commercial Managed Care - PPO | Admitting: Thoracic Surgery

## 2011-03-17 ENCOUNTER — Other Ambulatory Visit: Payer: Self-pay | Admitting: Thoracic Surgery

## 2011-03-23 ENCOUNTER — Encounter: Payer: Self-pay | Admitting: Thoracic Surgery

## 2011-03-23 ENCOUNTER — Ambulatory Visit (INDEPENDENT_AMBULATORY_CARE_PROVIDER_SITE_OTHER): Payer: Medicare Other | Admitting: Thoracic Surgery

## 2011-03-23 ENCOUNTER — Ambulatory Visit
Admission: RE | Admit: 2011-03-23 | Discharge: 2011-03-23 | Disposition: A | Discharge status: Home / Self Care | Payer: Commercial Managed Care - PPO | Source: Ambulatory Visit | Attending: Thoracic Surgery | Admitting: Thoracic Surgery

## 2011-03-23 VITALS — BP 133/77 | HR 92 | Resp 20 | Wt 250.0 lb

## 2011-03-23 DIAGNOSIS — S2329XA Dislocation of other parts of thorax, initial encounter: Secondary | ICD-10-CM

## 2011-03-23 DIAGNOSIS — K439 Ventral hernia without obstruction or gangrene: Secondary | ICD-10-CM

## 2011-03-23 DIAGNOSIS — Z9889 Other specified postprocedural states: Secondary | ICD-10-CM

## 2011-03-23 NOTE — Progress Notes (Signed)
HPI patient returns for followup today. His ventral hernia is able. His chest tube is a well healed after sternal rewiring. He's is a recent first toe removal of his nails. Lungs clear to auscultation percussion heart was regular sinus rhythm is stable overall. We will see him again as needed. There is no evidence of any infection. He will followup with Dr. Kathlee Nations Trigt as needed Current Outpatient Prescriptions  Medication Sig Dispense Refill  . aspirin 81 MG tablet Take 81 mg by mouth daily.        Marland Kitchen ezetimibe-simvastatin (VYTORIN) 10-20 MG per tablet Take 1 tablet by mouth at bedtime.        . furosemide (LASIX) 40 MG tablet Take 40 mg by mouth 2 (two) times daily.        . insulin glargine (LANTUS) 100 UNIT/ML injection Inject 48 Units into the skin daily.       Marland Kitchen losartan-hydrochlorothiazide (HYZAAR) 100-25 MG per tablet Take 1 tablet by mouth daily.        . metoprolol (TOPROL-XL) 100 MG 24 hr tablet Take 100 mg by mouth daily.        . potassium chloride (KLOR-CON) 20 MEQ packet Take 20 mEq by mouth 2 (two) times daily.           Review of Systems: Recent toenail surgery   Physical Exam lungs are clear to auscultation percussion heart regular sinus rhythm small  epigastric hernia status post sternal Rewiring  Diagnostic Tests: Chest x-ray shows normal postoperative changes   Impression: Epigastric hernia status post aortic valve replacement status post sternal rewiring   Plan: Followup by Dr. Lovett Sox as needed.

## 2012-06-20 ENCOUNTER — Other Ambulatory Visit: Payer: Self-pay | Admitting: Cardiovascular Disease

## 2012-06-20 NOTE — Telephone Encounter (Signed)
Rx was sent to pharmacy electronically. 

## 2012-07-03 ENCOUNTER — Ambulatory Visit (INDEPENDENT_AMBULATORY_CARE_PROVIDER_SITE_OTHER): Payer: Medicare Other | Admitting: Physician Assistant

## 2012-07-03 ENCOUNTER — Encounter: Payer: Self-pay | Admitting: Physician Assistant

## 2012-07-03 VITALS — BP 140/80 | HR 78 | Ht 67.0 in | Wt 256.0 lb

## 2012-07-03 DIAGNOSIS — I10 Essential (primary) hypertension: Secondary | ICD-10-CM

## 2012-07-03 DIAGNOSIS — E1169 Type 2 diabetes mellitus with other specified complication: Secondary | ICD-10-CM

## 2012-07-03 DIAGNOSIS — Z952 Presence of prosthetic heart valve: Secondary | ICD-10-CM

## 2012-07-03 DIAGNOSIS — G4730 Sleep apnea, unspecified: Secondary | ICD-10-CM

## 2012-07-03 DIAGNOSIS — E785 Hyperlipidemia, unspecified: Secondary | ICD-10-CM

## 2012-07-03 DIAGNOSIS — Z954 Presence of other heart-valve replacement: Secondary | ICD-10-CM

## 2012-07-03 DIAGNOSIS — E669 Obesity, unspecified: Secondary | ICD-10-CM

## 2012-07-03 DIAGNOSIS — R600 Localized edema: Secondary | ICD-10-CM

## 2012-07-03 DIAGNOSIS — R609 Edema, unspecified: Secondary | ICD-10-CM

## 2012-07-03 DIAGNOSIS — E119 Type 2 diabetes mellitus without complications: Secondary | ICD-10-CM | POA: Insufficient documentation

## 2012-07-03 MED ORDER — EZETIMIBE-SIMVASTATIN 10-20 MG PO TABS: 1.0000 | ORAL_TABLET | Freq: Every day | ORAL | Status: DC

## 2012-07-03 MED ORDER — METOPROLOL SUCCINATE ER 100 MG PO TB24: 100.0000 mg | ORAL_TABLET | Freq: Every day | ORAL | Status: DC

## 2012-07-03 NOTE — Assessment & Plan Note (Signed)
The patient does have compression stockings at home she does not wear a regular basis. I recommended that he start doing so immediately. He does wear shorts quite often that may be prohibitory to him. I did ask him to wear them at least at night. Also recommended he take an extra half tablet of Lasix for the next 3 days to help with some swelling. Patient may benefit from a venous studies.

## 2012-07-03 NOTE — Assessment & Plan Note (Signed)
Last 2-D echocardiogram was June 2013 showed ejection fraction of 50-55% mild/moderate concentric left LVH proximal septal thickening was noted. Normal wall motion. Left atrium mildly dilated. Aortic valve opened well. No aortic stenosis. Well-seated

## 2012-07-03 NOTE — Patient Instructions (Addendum)
Recommend wearing compression hose as much as possible. Particularly on the right leg Followup with Dr. Allyson Sabal in 6 months   Prescription were sent to pharmacy

## 2012-07-03 NOTE — Assessment & Plan Note (Signed)
Patient likely has some torn cartilage in his right knee which happened last fall. He supposed to see orthopedist. However, he has not been able to exercise as he has been prior to the injury. He has an increasing weight about 13 pounds some of which is some fluid likely from venous stasis.

## 2012-07-03 NOTE — Assessment & Plan Note (Signed)
Blood pressure controlled on Toprol-XL, Hyzaar, Lasix

## 2012-07-03 NOTE — Assessment & Plan Note (Signed)
On Vytorin last lipid panel October 2013 total cholesterol 123 discharge 104 LDL 66 VLDL 21 HDL 36

## 2012-07-03 NOTE — Progress Notes (Signed)
Date:  07/03/2012   ID:  Travis [name], DOB 08-03-1945, MRN [id number]  PCP:  No PCP Per Patient  Primary Cardiologist:  Allyson Sabal    History of Present Illness: Travis [name] is a 67 y.o. male  history of aortic valve replacement November 2010 by Dr. Kathlee [name] trait. The new valve is a magna ease #23 pericardial tissue valve. Patient had normal coronary arteries and normal LV function at that time. His history also includes hypertension, hyperlipidemia, and diabetes mellitus type 2, obstructive sleep apnea on CPAP, perioperative atrial fibrillation for which he was on amiodarone briefly. 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   Patient is doing well at this time. He does report a right knee injury last autumn which he thinks he torn some cartilage and has been unable to exercise at. He had been walking 5 miles per day and had lost some weight. He is now back up 13 pounds to 256. He also reports that 10 days ago he had some yellowish fluid drained from his right leg immobilizer. He does have a right greater than left lower edema. He sleeps in a recliner but states that that is because his nasal passages get stopped up and not sick necessarily due to orthopnea. He denies nausea, vomiting, fever, and dizziness, chest pain, shortness of breath, abdominal pain   Wt Readings from Last 3 Encounters:  07/03/12 256 lb (116.121 kg)  03/23/11 250 lb (113.399 kg)  12/16/10 214 lb (97.07 kg)     Past Medical History  Diagnosis Date  . HTN (hypertension)   . Diabetes mellitus type 2 in obese   . Obesity   . Hyperlipemia   . Sleep apnea   . Aortic stenosis     Current Outpatient Prescriptions  Medication Sig Dispense Refill  . aspirin 81 MG tablet Take 81 mg by mouth daily.        Marland Kitchen ezetimibe-simvastatin (VYTORIN) 10-20 MG per tablet Take 1 tablet by mouth at bedtime.  90 tablet  3  . furosemide (LASIX) 40 MG tablet Take 40 mg by mouth 2 (two) times daily.         . insulin glargine (LANTUS) 100 UNIT/ML injection Inject 51 Units into the skin daily.       Marland Kitchen losartan-hydrochlorothiazide (HYZAAR) 100-25 MG per tablet TAKE ONE TABLET BY MOUTH ONCE DAILY  90 tablet  2  . metoprolol succinate (TOPROL-XL) 100 MG 24 hr tablet Take 1 tablet (100 mg total) by mouth daily.  90 tablet  3  . Omega-3 Fatty Acids (FISH OIL) 1000 MG CAPS Take 2 capsules by mouth daily.      . potassium chloride (KLOR-CON) 20 MEQ packet Take 20 mEq by mouth 2 (two) times daily.         No current facility-administered medications for this visit.    Allergies:    Allergies  Allergen Reactions  . Norvasc (Amlodipine Besylate) Itching  . Penicillins Itching  . Prinivil (Lisinopril) Itching    Social History:  The patient  reports that he quit smoking about 24 years ago. His smoking use included Cigarettes. He smoked 0.00 packs per day. He has never used smokeless tobacco. He reports that he does not drink alcohol or use illicit drugs.   ROS:  Please see the history of present illness.  All other systems reviewed and negative.   PHYSICAL EXAM: VS:  BP 140/80  Pulse 78  Ht  5\' 7"  (1.702 m)  Wt 256 lb (116.121 kg)  BMI 40.09 kg/m2 Well nourished, well developed, in no acute distress HEENT: Pupils are equal round react to light accommodation extraocular movements are intact.  Neck: no JVDNo cervical lymphadenopathy. Cardiac: Regular rate and rhythm without murmurs rubs or gallops. Lungs:  clear to auscultation bilaterally, no wheezing, rhonchi or rales Abd: Obese, soft, nontender, positive bowel sounds all quadrants,  Ext: 2+ right lower extremity edema. Trace on the left  2+ radial and dorsalis pedis pulses. Skin: warm and dry Neuro:  Grossly normal  EKG:  Sinus rhythm rate of 78 beats per minute    ASSESSMENT AND PLAN:  Problem List Items Addressed This Visit   Status post aortic valve replacement, November 2010, magna ease #23 pericardial tissue valve     Last 2-D  echocardiogram was June 2013 showed ejection fraction of 50-55% mild/moderate concentric left LVH proximal septal thickening was noted. Normal wall motion. Left atrium mildly dilated. Aortic valve opened well. No aortic stenosis. Well-seated    Relevant Medications      ezetimibe-simvastatin (VYTORIN) 10-20 MG per tablet      metoprolol succinate (TOPROL-XL) 24 hr tablet   Obesity     Patient likely has some torn cartilage in his right knee which happened last fall. He supposed to see orthopedist. However, he has not been able to exercise as he has been prior to the injury. He has an increasing weight about 13 pounds some of which is some fluid likely from venous stasis.    Essential hypertension     Blood pressure controlled on Toprol-XL, Hyzaar, Lasix    Relevant Medications      ezetimibe-simvastatin (VYTORIN) 10-20 MG per tablet      metoprolol succinate (TOPROL-XL) 24 hr tablet   Hyperlipidemia     On Vytorin last lipid panel October 2013 total cholesterol 123 discharge 104 LDL 66 VLDL 21 HDL 36    Relevant Medications      ezetimibe-simvastatin (VYTORIN) 10-20 MG per tablet      metoprolol succinate (TOPROL-XL) 24 hr tablet   Diabetes mellitus type 2 in obese   Sleep apnea CPAP   Lower extremity edema, right greater than left, likely from venous insufficiency     The patient does have compression stockings at home she does not wear a regular basis. I recommended that he start doing so immediately. He does wear shorts quite often that may be prohibitory to him. I did ask him to wear them at least at night. Also recommended he take an extra half tablet of Lasix for the next 3 days to help with some swelling. Patient may benefit from a venous studies.     Other Visit Diagnoses   S/P AVR (aortic valve replacement)    -  Primary    Relevant Medications       ezetimibe-simvastatin (VYTORIN) 10-20 MG per tablet       metoprolol succinate (TOPROL-XL) 24 hr tablet    Other Relevant Orders        EKG 12-Lead

## 2012-07-11 ENCOUNTER — Encounter: Payer: Self-pay | Admitting: *Deleted

## 2012-07-18 ENCOUNTER — Encounter: Payer: Self-pay | Admitting: Cardiovascular Disease

## 2012-10-25 ENCOUNTER — Other Ambulatory Visit: Payer: Self-pay | Admitting: Cardiovascular Disease

## 2012-10-25 NOTE — Telephone Encounter (Signed)
Rx was sent to pharmacy electronically. 

## 2012-12-20 ENCOUNTER — Encounter: Payer: Self-pay | Admitting: Internal Medicine

## 2012-12-20 ENCOUNTER — Ambulatory Visit (INDEPENDENT_AMBULATORY_CARE_PROVIDER_SITE_OTHER): Payer: Commercial Managed Care - PPO | Admitting: Cardiovascular Disease

## 2012-12-20 ENCOUNTER — Encounter: Payer: Self-pay | Admitting: Cardiovascular Disease

## 2012-12-20 VITALS — BP 150/90 | HR 87 | Ht 67.5 in | Wt 262.7 lb

## 2012-12-20 DIAGNOSIS — Z954 Presence of other heart-valve replacement: Secondary | ICD-10-CM

## 2012-12-20 DIAGNOSIS — Z952 Presence of prosthetic heart valve: Secondary | ICD-10-CM

## 2012-12-20 DIAGNOSIS — I10 Essential (primary) hypertension: Secondary | ICD-10-CM

## 2012-12-20 NOTE — Progress Notes (Signed)
12/20/2012 Travis [name]   03/07/1945  [id number]  Primary Physician No PCP Per Patient Primary Cardiologist: Runell Gess MD [name]   HPI:  The patient is a 67 year old moderately overweight married Caucasian male with no children who I last saw 6 months ago. He is status post aortic valve replacement by Dr. Kathlee Nations Trigt with a Magna Ease #23 pericardial tissue valve for severe aortic stenosis in November of 2010. He had normal coronary arteries at that time with normal LV function.  His other problems include hypertension, hyperlipidemia, and non-insulin requiring diabetes. He has some perioperative atrial fibrillation on amiodarone briefly. He had sternal re-wiring in March of 2011 because of dehiscence but has done well since. He does have obstructive sleep apnea on CPAP. He has been asymptomatic since I last saw him. A 2D echo performed July 22, 2011 revealed normal LV function with mild to moderate concentric LVH and a well-functioning aortic prosthesis.  He saw [name] [facility] in the office 652/14. He's been doing well since other than gaining weight because of lack of exercise secondary to right knee pain.     Current Outpatient Prescriptions  Medication Sig Dispense Refill  . aspirin 81 MG tablet Take 81 mg by mouth daily.        . diclofenac (VOLTAREN) 75 MG EC tablet Take 75 mg by mouth 2 (two) times daily.      Marland Kitchen ezetimibe-simvastatin (VYTORIN) 10-20 MG per tablet Take 1 tablet by mouth at bedtime.  90 tablet  3  . furosemide (LASIX) 40 MG tablet Take 1 tablet (40 mg total) by mouth 2 (two) times daily.  60 tablet  9  . insulin glargine (LANTUS) 100 UNIT/ML injection Inject 55 Units into the skin daily.       Marland Kitchen losartan-hydrochlorothiazide (HYZAAR) 100-25 MG per tablet TAKE ONE TABLET BY MOUTH ONCE DAILY  90 tablet  2  . metoprolol succinate (TOPROL-XL) 100 MG 24 hr tablet Take 1 tablet (100 mg total) by mouth daily.  90 tablet  3  . Omega-3 Fatty  Acids (FISH OIL) 1000 MG CAPS Take 2 capsules by mouth daily.      . potassium chloride (KLOR-CON) 20 MEQ packet Take 20 mEq by mouth 2 (two) times daily.         No current facility-administered medications for this visit.    Allergies  Allergen Reactions  . Norvasc [Amlodipine Besylate] Itching  . Penicillins Itching  . Prinivil [Lisinopril] Itching    History   Social History  . Marital Status: Married    Spouse Name: N/A    Number of Children: N/A  . Years of Education: N/A   Occupational History  . Not on file.   Social History Main Topics  . Smoking status: Former Smoker    Types: Cigarettes    Quit date: 02/02/1988  . Smokeless tobacco: Never Used  . Alcohol Use: No  . Drug Use: No  . Sexual Activity: Not on file   Other Topics Concern  . Not on file   Social History Narrative  . No narrative on file     Review of Systems: General: negative for chills, fever, night sweats or weight changes.  Cardiovascular: negative for chest pain, dyspnea on exertion, edema, orthopnea, palpitations, paroxysmal nocturnal dyspnea or shortness of breath Dermatological: negative for rash Respiratory: negative for cough or wheezing Urologic: negative for hematuria Abdominal: negative for nausea, vomiting, diarrhea, bright red blood per rectum, melena, or  hematemesis Neurologic: negative for visual changes, syncope, or dizziness All other systems reviewed and are otherwise negative except as noted above.    Blood pressure 150/90, pulse 87, height 5' 7.5" (1.715 m), weight 262 lb 11.2 oz (119.16 kg).  General appearance: alert and no distress Neck: no adenopathy, no carotid bruit, no JVD, supple, symmetrical, trachea midline and thyroid not enlarged, symmetric, no tenderness/mass/nodules Lungs: clear to auscultation bilaterally Heart: regular rate and rhythm, S1, S2 normal, no murmur, click, rub or gallop Extremities: extremities normal, atraumatic, no cyanosis or  edema  EKG normal sinus rhythm at 87 without ST or T wave changes  ASSESSMENT AND PLAN:   Status post aortic valve replacement, November 2010, magna ease #23 pericardial tissue valve Status post bioprosthetic aortic valve replacement by Dr. Rosemary Holms right [facility] Ease  #23 pericardial tissue valve) for severe aortic stenosis November 2010. He had normal coronary arteries and normal LV function. Left to the echocardiogram performed 07/22/11 revealed normal LV function with a well-functioning aortic bioprosthesis. We will recheck a 2-D echo  Essential hypertension Borderline control on current medications  Hyperlipidemia On statin therapy followed by his PCP      Runell Gess MD [facility], [facility] 12/20/2012 10:24 AM

## 2012-12-20 NOTE — Patient Instructions (Signed)
  We will see you back in follow up in 1 year with Dr Berry  Dr Berry has ordered an echocardiogram   

## 2012-12-20 NOTE — Assessment & Plan Note (Signed)
On statin therapy followed by his PCP 

## 2012-12-20 NOTE — Assessment & Plan Note (Signed)
Status post bioprosthetic aortic valve replacement by Dr. Rosemary Holms right [facility] Ease  #23 pericardial tissue valve) for severe aortic stenosis November 2010. He had normal coronary arteries and normal LV function. Left to the echocardiogram performed 07/22/11 revealed normal LV function with a well-functioning aortic bioprosthesis. We will recheck a 2-D echo

## 2012-12-20 NOTE — Assessment & Plan Note (Signed)
Borderline control on current medications 

## 2012-12-22 ENCOUNTER — Other Ambulatory Visit: Payer: Self-pay | Admitting: Cardiovascular Disease

## 2012-12-22 NOTE — Telephone Encounter (Signed)
Rx was sent to pharmacy electronically. 

## 2012-12-26 ENCOUNTER — Other Ambulatory Visit: Payer: Self-pay | Admitting: Cardiovascular Disease

## 2012-12-26 NOTE — Telephone Encounter (Signed)
Rx was sent to pharmacy electronically. 

## 2013-01-08 ENCOUNTER — Ambulatory Visit (HOSPITAL_COMMUNITY): Payer: Commercial Managed Care - PPO

## 2013-01-10 ENCOUNTER — Ambulatory Visit (HOSPITAL_COMMUNITY)
Admission: RE | Admit: 2013-01-10 | Discharge: 2013-01-10 | Disposition: A | Discharge status: Home / Self Care | Payer: Commercial Managed Care - PPO | Source: Ambulatory Visit | Attending: Cardiovascular Disease | Admitting: Cardiovascular Disease

## 2013-01-10 DIAGNOSIS — Z09 Encounter for follow-up examination after completed treatment for conditions other than malignant neoplasm: Secondary | ICD-10-CM | POA: Insufficient documentation

## 2013-01-10 DIAGNOSIS — I359 Nonrheumatic aortic valve disorder, unspecified: Secondary | ICD-10-CM

## 2013-01-10 DIAGNOSIS — Z952 Presence of prosthetic heart valve: Secondary | ICD-10-CM | POA: Insufficient documentation

## 2013-01-10 NOTE — Progress Notes (Signed)
2D Echo Performed 01/10/2013    Mili Piltz, RCS  

## 2013-01-11 ENCOUNTER — Encounter: Payer: Self-pay | Admitting: *Deleted

## 2013-02-06 ENCOUNTER — Telehealth: Payer: Self-pay | Admitting: *Deleted

## 2013-02-06 NOTE — Telephone Encounter (Signed)
Chart reviewed by Dr Allyson SabalBerry and clearance given for knee replacement.  Form faxed back to [facility]

## 2013-02-19 ENCOUNTER — Other Ambulatory Visit: Payer: Self-pay | Admitting: Cardiovascular Disease

## 2013-02-19 ENCOUNTER — Encounter: Payer: Self-pay | Admitting: Cardiovascular Disease

## 2013-02-19 NOTE — Telephone Encounter (Signed)
Rx was sent to pharmacy electronically. 

## 2013-05-18 ENCOUNTER — Telehealth: Payer: Self-pay | Admitting: Cardiovascular Disease

## 2013-05-18 NOTE — Telephone Encounter (Signed)
Please call-His blood pressure seems to be dropping around lunch time,thinks it might be his medicine.

## 2013-05-18 NOTE — Telephone Encounter (Signed)
Patient called to report that he recently had knee surgery. [facility] has been checking his blood pressure 2-3 days per week around noon time and it has been found to be running around 95/50 to 95/55. He is asymptomatic. When he went to his MD office for a post surgery visit around 8:00 AM it was 124/65. They recommended that he contact Dr. Allyson SabalBerry to change his diuretic if there was any concerns about his blood pressure. Patient says that he "feels fine." Denies having any dizziness, fatigue, or CP. He does admit to his mouth feeling dry after he takes oxycodone for his pain. I recommended to patient to continue his medication as directed. He needs to stay well hydrated. Continue to monitor his blood pressure. If he develops any symptoms of lightheadedness or dizziness especially upon standing he is to call back to notify. Patient voiced his understanding and will notify us of any change in his symptoms.

## 2013-05-30 ENCOUNTER — Telehealth: Payer: Self-pay | Admitting: *Deleted

## 2013-05-30 NOTE — Telephone Encounter (Signed)
I called patient in response to a fax we received from his wife.  Mr Travis [name] is recovering from knee replacement surgery.  His blood pressure had been running low in PACU.  Mr Travis [name] has been feeling great!  No dizziness or lightheadedness.  He is no longer taking any pain meds.  We talked it over.  For right now he is going to stay on his regular dose of hyzaar.  If he develops any symptoms or if blood pressure is low at appt on Monday, he will cut hyzaar in half.  I spoke with Dr Allyson SabalBerry and he is in agreeance with plan.

## 2013-07-12 ENCOUNTER — Other Ambulatory Visit: Payer: Self-pay | Admitting: Physician Assistant

## 2013-07-12 NOTE — Telephone Encounter (Signed)
Rx refill sent to patient pharmacy   

## 2013-10-01 ENCOUNTER — Other Ambulatory Visit: Payer: Self-pay | Admitting: Cardiovascular Disease

## 2013-10-01 NOTE — Telephone Encounter (Signed)
Rx was sent to pharmacy electronically. 

## 2013-10-11 ENCOUNTER — Telehealth: Payer: Self-pay | Admitting: Cardiovascular Disease

## 2013-10-15 NOTE — Telephone Encounter (Signed)
Closed encounter °

## 2013-11-14 ENCOUNTER — Other Ambulatory Visit: Payer: Self-pay | Admitting: Cardiovascular Disease

## 2013-11-14 NOTE — Telephone Encounter (Signed)
Rx was sent to pharmacy electronically. OV 11/18

## 2013-12-19 ENCOUNTER — Encounter: Payer: Self-pay | Admitting: Cardiovascular Disease

## 2013-12-19 ENCOUNTER — Ambulatory Visit (INDEPENDENT_AMBULATORY_CARE_PROVIDER_SITE_OTHER): Payer: Commercial Managed Care - PPO | Admitting: Cardiovascular Disease

## 2013-12-19 DIAGNOSIS — Z952 Presence of prosthetic heart valve: Secondary | ICD-10-CM

## 2013-12-19 DIAGNOSIS — I10 Essential (primary) hypertension: Secondary | ICD-10-CM

## 2013-12-19 DIAGNOSIS — Z954 Presence of other heart-valve replacement: Secondary | ICD-10-CM

## 2013-12-19 NOTE — Assessment & Plan Note (Signed)
History of hypertension with blood pressure measured today at 142/80 on metoprolol XL 200 mg daily, losartan hydrochlorothiazide 100/25. Continue current medications at current dosing

## 2013-12-19 NOTE — Patient Instructions (Signed)
  We will see you back in follow up in 1 year with Dr Allyson SabalBerry.   Dr Allyson SabalBerry has ordered:  Echocardiogram (next month). Echocardiography is a painless test that uses sound waves to create images of your heart. It provides your doctor with information about the size and shape of your heart and how well your heart's chambers and valves are working. This procedure takes approximately one hour. There are no restrictions for this procedure.

## 2013-12-19 NOTE — Progress Notes (Signed)
12/19/2013 Travis [name]   08/13/1945  [id number]  Primary Physician Pcp Not In System Primary Cardiologist: Runell GessJonathan J. Lileigh Fahringer MD Roseanne RenoFACP,FACC,FAHA, FSCAI   HPI:  The patient is a 68 year old moderately overweight married Caucasian male with no children who I last saw 12 months ago. He is status post aortic valve replacement by Dr. Kathlee NationsPeter Van Trigt with a Magna Ease #23 pericardial tissue valve for severe aortic stenosis in November of 2010. He had normal coronary arteries at that time with normal LV function.  His other problems include hypertension, hyperlipidemia, and non-insulin requiring diabetes. He has some perioperative atrial fibrillation on amiodarone briefly. He had sternal re-wiring in March of 2011 because of dehiscence but has done well since. He does have obstructive sleep apnea on CPAP. He has been asymptomatic since I last saw him. A 2D echo performed 01/10/13 revealed normal LV systolic function with mild concentric left ventricular hypertrophy and a normally functioning aortic bioprosthesis.    Current Outpatient Prescriptions  Medication Sig Dispense Refill  . aspirin 81 MG tablet Take 81 mg by mouth daily.      . canagliflozin (INVOKANA) 100 MG TABS tablet Take 1 tablet by mouth daily.    . diclofenac (VOLTAREN) 75 MG EC tablet Take 75 mg by mouth 2 (two) times daily.    . furosemide (LASIX) 40 MG tablet TAKE ONE TABLET BY MOUTH 2 TIMES DAILY 60 tablet 1  . insulin glargine (LANTUS) 100 UNIT/ML injection Inject 55 Units into the skin daily.     Marland Kitchen. losartan-hydrochlorothiazide (HYZAAR) 100-25 MG per tablet TAKE ONE TABLET BY MOUTH ONCE DAILY 90 tablet 0  . metoprolol (TOPROL-XL) 200 MG 24 hr tablet Take 0.5 tablets (100 mg total) by mouth daily. 45 tablet 3  . potassium chloride SA (K-DUR,KLOR-CON) 20 MEQ tablet TAKE ONE TABLET BY MOUTH 2 TIMES DAILY 60 tablet 2  . VYTORIN 10-20 MG per tablet TAKE 1 TABLET BY MOUTH AT BEDTIME. 90 tablet 3   No current  facility-administered medications for this visit.    Allergies  Allergen Reactions  . Norvasc [Amlodipine Besylate] Itching  . Penicillins Itching  . Prinivil [Lisinopril] Itching    History   Social History  . Marital Status: Married    Spouse Name: N/A    Number of Children: N/A  . Years of Education: N/A   Occupational History  . Not on file.   Social History Main Topics  . Smoking status: Former Smoker    Types: Cigarettes    Quit date: 02/02/1988  . Smokeless tobacco: Never Used  . Alcohol Use: No  . Drug Use: No  . Sexual Activity: Not on file   Other Topics Concern  . Not on file   Social History Narrative     Review of Systems: General: negative for chills, fever, night sweats or weight changes.  Cardiovascular: negative for chest pain, dyspnea on exertion, edema, orthopnea, palpitations, paroxysmal nocturnal dyspnea or shortness of breath Dermatological: negative for rash Respiratory: negative for cough or wheezing Urologic: negative for hematuria Abdominal: negative for nausea, vomiting, diarrhea, bright red blood per rectum, melena, or hematemesis Neurologic: negative for visual changes, syncope, or dizziness All other systems reviewed and are otherwise negative except as noted above.    Blood pressure 142/80, pulse 85, height 5\' 7"  (1.702 m), weight 255 lb 4.8 oz (115.803 kg).  General appearance: alert and no distress Neck: no adenopathy, no carotid bruit, no JVD, supple, symmetrical, trachea midline and thyroid not  enlarged, symmetric, no tenderness/mass/nodules Lungs: clear to auscultation bilaterally Heart: regular rate and rhythm, S1, S2 normal, no murmur, click, rub or gallop Extremities: extremities normal, atraumatic, no cyanosis or edema  EKG normal sinus rhythm at 85 with evidence of septal infarct but no acute ST or T-wave changes. I personally reviewed this EKG  ASSESSMENT AND PLAN:   Essential hypertension History of hypertension  with blood pressure measured today at 142/80 on metoprolol XL 200 mg daily, losartan hydrochlorothiazide 100/25. Continue current medications at current dosing  Hyperlipidemia History of hyperlipidemia on Vytorin 10/20 mg daily. His recent lipid profile performed by primary care provider on 02/09/13 revealed a total cholesterol of 132, LDL 67 and HDL of 32.  Status post aortic valve replacement, November 2010, magna ease #23 pericardial tissue valve Status post aortic valve replacement by Dr. Kathlee NationsPeter Van Trigt November 2010 with a Magna Ease  #23 pericardial tissue valve for severe aortic stenosis. He had normal coronary arteries and normal LV function at that time. His recent 2-D echocardiogram performed 01/10/13 revealed normal LV systolic function with a normally functioning bioprosthetic aortic valve. We will recheck this next month. He is aware of SBE prophylaxis.      Runell GessJonathan J. Jaylyn Booher MD FACP,FACC,FAHA, [facility] 12/19/2013 9:52 AM

## 2013-12-19 NOTE — Assessment & Plan Note (Signed)
History of hyperlipidemia on Vytorin 10/20 mg daily. His recent lipid profile performed by primary care provider on 02/09/13 revealed a total cholesterol of 132, LDL 67 and HDL of 32.

## 2013-12-19 NOTE — Assessment & Plan Note (Signed)
Status post aortic valve replacement by Dr. Kathlee NationsPeter Van Trigt November 2010 with a Magna Ease  #23 pericardial tissue valve for severe aortic stenosis. He had normal coronary arteries and normal LV function at that time. His recent 2-D echocardiogram performed 01/10/13 revealed normal LV systolic function with a normally functioning bioprosthetic aortic valve. We will recheck this next month. He is aware of SBE prophylaxis.

## 2013-12-24 ENCOUNTER — Other Ambulatory Visit: Payer: Self-pay | Admitting: Cardiovascular Disease

## 2013-12-24 NOTE — Telephone Encounter (Signed)
Rx was sent to pharmacy electronically. 

## 2013-12-26 ENCOUNTER — Other Ambulatory Visit: Payer: Self-pay | Admitting: Cardiovascular Disease

## 2013-12-26 NOTE — Telephone Encounter (Signed)
Rx refill sent to patient pharmacy   

## 2014-01-15 ENCOUNTER — Telehealth: Payer: Self-pay | Admitting: Cardiovascular Disease

## 2014-01-15 MED ORDER — POTASSIUM CHLORIDE CRYS ER 20 MEQ PO TBCR: EXTENDED_RELEASE_TABLET | ORAL | Status: DC

## 2014-01-15 MED ORDER — FUROSEMIDE 40 MG PO TABS: ORAL_TABLET | ORAL | Status: DC

## 2014-01-15 NOTE — Telephone Encounter (Signed)
Need new prescriptions for his Furosemide 40 mg #60 and Potassium Chloride 20 meq #60.

## 2014-01-15 NOTE — Telephone Encounter (Signed)
Scripts sent to pharmacy electronically.

## 2014-01-16 ENCOUNTER — Ambulatory Visit (HOSPITAL_COMMUNITY)
Admission: RE | Admit: 2014-01-16 | Discharge: 2014-01-16 | Disposition: A | Discharge status: Home / Self Care | Payer: Medicare Other | Source: Ambulatory Visit | Attending: Cardiovascular Disease | Admitting: Cardiovascular Disease

## 2014-01-16 DIAGNOSIS — I4891 Unspecified atrial fibrillation: Secondary | ICD-10-CM | POA: Insufficient documentation

## 2014-01-16 DIAGNOSIS — Z954 Presence of other heart-valve replacement: Secondary | ICD-10-CM | POA: Insufficient documentation

## 2014-01-16 DIAGNOSIS — Z87891 Personal history of nicotine dependence: Secondary | ICD-10-CM | POA: Diagnosis not present

## 2014-01-16 DIAGNOSIS — E785 Hyperlipidemia, unspecified: Secondary | ICD-10-CM | POA: Diagnosis not present

## 2014-01-16 DIAGNOSIS — I10 Essential (primary) hypertension: Secondary | ICD-10-CM | POA: Insufficient documentation

## 2014-01-16 DIAGNOSIS — E119 Type 2 diabetes mellitus without complications: Secondary | ICD-10-CM | POA: Diagnosis not present

## 2014-01-16 DIAGNOSIS — R609 Edema, unspecified: Secondary | ICD-10-CM | POA: Diagnosis not present

## 2014-01-16 DIAGNOSIS — G4733 Obstructive sleep apnea (adult) (pediatric): Secondary | ICD-10-CM | POA: Diagnosis not present

## 2014-01-16 DIAGNOSIS — I517 Cardiomegaly: Secondary | ICD-10-CM

## 2014-01-16 DIAGNOSIS — Z4509 Encounter for adjustment and management of other cardiac device: Secondary | ICD-10-CM | POA: Diagnosis not present

## 2014-01-16 DIAGNOSIS — Z952 Presence of prosthetic heart valve: Secondary | ICD-10-CM

## 2014-01-16 NOTE — Progress Notes (Signed)
2D Echocardiogram Complete.  01/16/2014   Davina Howlett, RDCS  

## 2014-01-22 ENCOUNTER — Encounter: Payer: Self-pay | Admitting: *Deleted

## 2014-03-22 ENCOUNTER — Other Ambulatory Visit: Payer: Self-pay | Admitting: Cardiovascular Disease

## 2014-03-22 NOTE — Telephone Encounter (Signed)
Rx refill denied to patient pharmacy   

## 2014-03-30 ENCOUNTER — Other Ambulatory Visit: Payer: Self-pay | Admitting: Cardiovascular Disease

## 2014-04-01 NOTE — Telephone Encounter (Signed)
Rx(s) sent to pharmacy electronically.  

## 2014-07-03 ENCOUNTER — Telehealth: Payer: Self-pay | Admitting: Cardiovascular Disease

## 2014-07-03 MED ORDER — FUROSEMIDE 40 MG PO TABS: ORAL_TABLET | ORAL | Status: DC

## 2014-07-03 NOTE — Telephone Encounter (Signed)
°  1. Which medications need to be refilled? Furosemide  2. Which pharmacy is medication to be sent to?[id number] 3. Do they need a 30 day or 90 day supply? 90 and refilld  4. Would they like a call back once the medication has been sent to the pharmacy? no

## 2014-07-03 NOTE — Telephone Encounter (Signed)
Rx refilled.

## 2014-07-17 ENCOUNTER — Other Ambulatory Visit: Payer: Self-pay | Admitting: Cardiovascular Disease

## 2014-07-17 MED ORDER — EZETIMIBE-SIMVASTATIN 10-20 MG PO TABS: 1.0000 | ORAL_TABLET | Freq: Every day | ORAL | Status: DC

## 2014-07-17 NOTE — Telephone Encounter (Signed)
°  1. Which medications need to be refilled? Vytorin  2. Which pharmacy is medication to be sent to?[phone]  3. Do they need a 30 day or 90 day supply? 90 and refills  4. Would they like a call back once the medication has been sent to the pharmacy? yes

## 2014-07-17 NOTE — Telephone Encounter (Signed)
E-SENT MEDICATION. PATIENT AWARE 

## 2014-10-15 ENCOUNTER — Telehealth: Payer: Self-pay | Admitting: *Deleted

## 2014-10-15 MED ORDER — LOSARTAN POTASSIUM-HCTZ 100-25 MG PO TABS: 1.0000 | ORAL_TABLET | Freq: Every day | ORAL | Status: DC

## 2014-10-15 MED ORDER — POTASSIUM CHLORIDE CRYS ER 20 MEQ PO TBCR: 20.0000 meq | EXTENDED_RELEASE_TABLET | Freq: Every day | ORAL | Status: DC

## 2014-10-28 DIAGNOSIS — Z96652 Presence of left artificial knee joint: Secondary | ICD-10-CM | POA: Insufficient documentation

## 2014-11-12 ENCOUNTER — Other Ambulatory Visit: Payer: Self-pay | Admitting: Cardiovascular Disease

## 2014-11-12 NOTE — Telephone Encounter (Signed)
Rx request sent to pharmacy.  

## 2014-12-06 ENCOUNTER — Other Ambulatory Visit: Payer: Self-pay | Admitting: Cardiovascular Disease

## 2014-12-07 ENCOUNTER — Other Ambulatory Visit: Payer: Self-pay | Admitting: Cardiovascular Disease

## 2014-12-25 ENCOUNTER — Other Ambulatory Visit: Payer: Self-pay | Admitting: *Deleted

## 2014-12-25 MED ORDER — METOPROLOL SUCCINATE ER 200 MG PO TB24: ORAL_TABLET | ORAL | Status: DC

## 2015-01-01 ENCOUNTER — Other Ambulatory Visit: Payer: Self-pay | Admitting: Cardiovascular Disease

## 2015-01-17 ENCOUNTER — Ambulatory Visit (INDEPENDENT_AMBULATORY_CARE_PROVIDER_SITE_OTHER): Payer: Medicare Other | Admitting: Cardiovascular Disease

## 2015-01-17 ENCOUNTER — Encounter: Payer: Self-pay | Admitting: Cardiovascular Disease

## 2015-01-17 VITALS — BP 144/90 | HR 79 | Ht 67.0 in | Wt 258.0 lb

## 2015-01-17 DIAGNOSIS — I10 Essential (primary) hypertension: Secondary | ICD-10-CM | POA: Diagnosis not present

## 2015-01-17 MED ORDER — METOPROLOL SUCCINATE ER 200 MG PO TB24: ORAL_TABLET | ORAL | Status: DC

## 2015-01-17 NOTE — Progress Notes (Signed)
01/17/2015 Travis [name]   14-Apr-1945  [id number]  Primary Physician Venia Minks, NP Primary Cardiologist: Runell Gess MD [name]   HPI:  The patient is a 69 year old moderately overweight married Caucasian male with no children who I last saw 12 months ago. He is status post aortic valve replacement by Dr. Kathlee Nations Trigt with a Magna Ease #23 pericardial tissue valve for severe aortic stenosis in November of 2010. He had normal coronary arteries at that time with normal LV function.  His other problems include hypertension, hyperlipidemia, and non-insulin requiring diabetes. He has some perioperative atrial fibrillation on amiodarone briefly. He had sternal re-wiring in March of 2011 because of dehiscence but has done well since. He does have obstructive sleep apnea on CPAP. He has been asymptomatic since I last saw him. A 2D echo performed 01/16/14 revealed normal LV systolic function with mild concentric left ventricular hypertrophy and a normally functioning aortic bioprosthesis.recent blood work performed by his PCP on 09/25/14 revealed total cholesterol 116, LDL 52 and HDL of 31.   Current Outpatient Prescriptions  Medication Sig Dispense Refill  . aspirin 81 MG tablet Take 81 mg by mouth daily.      . diclofenac (VOLTAREN) 75 MG EC tablet Take 75 mg by mouth 2 (two) times daily.    Marland Kitchen ezetimibe-simvastatin (VYTORIN) 10-20 MG per tablet Take 1 tablet by mouth at bedtime. 90 tablet 3  . furosemide (LASIX) 40 MG tablet TAKE ONE TABLET BY MOUTH 2 TIMES DAILY 180 tablet 1  . insulin glargine (LANTUS) 100 UNIT/ML injection Inject 55 Units into the skin daily.     Marland Kitchen losartan-hydrochlorothiazide (HYZAAR) 100-25 MG tablet Take 1 tablet by mouth  daily 30 tablet 0  . metoprolol (TOPROL-XL) 200 MG 24 hr tablet TAKE 1/2 TABLET (100 MG TOTAL) BY MOUTH DAILY. 15 tablet 0  . potassium chloride SA (K-DUR,KLOR-CON) 20 MEQ tablet Take 1 tablet by mouth  daily 30 tablet 0   No  current facility-administered medications for this visit.    Allergies  Allergen Reactions  . Norvasc [Amlodipine Besylate] Itching  . Penicillins Itching  . Prinivil [Lisinopril] Itching    Social History   Social History  . Marital Status: Married    Spouse Name: N/A  . Number of Children: N/A  . Years of Education: N/A   Occupational History  . Not on file.   Social History Main Topics  . Smoking status: Former Smoker    Types: Cigarettes    Quit date: 02/02/1988  . Smokeless tobacco: Never Used  . Alcohol Use: No  . Drug Use: No  . Sexual Activity: Not on file   Other Topics Concern  . Not on file   Social History Narrative     Review of Systems: General: negative for chills, fever, night sweats or weight changes.  Cardiovascular: negative for chest pain, dyspnea on exertion, edema, orthopnea, palpitations, paroxysmal nocturnal dyspnea or shortness of breath Dermatological: negative for rash Respiratory: negative for cough or wheezing Urologic: negative for hematuria Abdominal: negative for nausea, vomiting, diarrhea, bright red blood per rectum, melena, or hematemesis Neurologic: negative for visual changes, syncope, or dizziness All other systems reviewed and are otherwise negative except as noted above.    Blood pressure 144/90, pulse 79, height  (1.702 m), weight 258 lb (117.028 kg).  General appearance: alert and no distress Neck: no adenopathy, no carotid bruit, no JVD, supple, symmetrical, trachea midline and thyroid not enlarged, symmetric, no  tenderness/mass/nodules Lungs: clear to auscultation bilaterally Heart: regular rate and rhythm, S1, S2 normal, no murmur, click, rub or gallop Extremities: extremities normal, atraumatic, no cyanosis or edema  EKG normal sinus rhythm at 79 with nonspecific ST and T-wave changes. He did have a PVC and isolate Q wave in lead 3. I personally reviewed this EKG  ASSESSMENT AND PLAN:   Status post aortic  valve replacement, November 2010, magna ease #23 pericardial tissue valve Status post aortic valve replacement November 2010 with a magna ease 23 mm pericardial tissue valve. His last echo performed 01/16/14 revealed normal LV function with a well functioning aortic bioprosthesis. Recheck a 2-D echocardiogram  Hyperlipidemia History of hyperlipidemia on simvastatin and Zetia with recent lipid profile checked by his PCP 09/25/14 revealing total cholesterol 116, LDL of 52 and HDL of 31.  Essential hypertension History of hypertension blood pressure measured 144/90. He says it's usually less than this. He is on losartan, Hydrocort Thiacide and metoprolol. Continue current meds at current dosing      Runell GessJonathan J. Kariana Wiles MD [facility] LLCFACP,FACC,FAHA, [facility] 01/17/2015 11:15 AM

## 2015-01-17 NOTE — Assessment & Plan Note (Signed)
History of hypertension blood pressure measured 144/90. He says it's usually less than this. He is on losartan, Hydrocort Thiacide and metoprolol. Continue current meds at current dosing

## 2015-01-17 NOTE — Patient Instructions (Addendum)
Medication Instructions:  Your physician recommends that you continue on your current medications as directed. Please refer to the Current Medication list given to you today.  Labwork: NONE  Testing/Procedures: Your physician has requested that you have an echocardiogram. Echocardiography is a painless test that uses sound waves to create images of your heart. It provides your doctor with information about the size and shape of your heart and how well your heart's chambers and valves are working. This procedure takes approximately one hour. There are no restrictions for this procedure.  Follow-Up: We request that you follow-up in: 1 year with Dr San MorelleBerry  You will receive a reminder letter in the mail two months in advance. If you don't receive a letter, please call our office to schedule the follow-up appointment.    If you need a refill on your cardiac medications before your next appointment, please call your pharmacy.

## 2015-01-17 NOTE — Assessment & Plan Note (Signed)
Status post aortic valve replacement November 2010 with a magna ease 23 mm pericardial tissue valve. His last echo performed 01/16/14 revealed normal LV function with a well functioning aortic bioprosthesis. Recheck a 2-D echocardiogram

## 2015-01-17 NOTE — Assessment & Plan Note (Signed)
History of hyperlipidemia on simvastatin and Zetia with recent lipid profile checked by his PCP 09/25/14 revealing total cholesterol 116, LDL of 52 and HDL of 31.

## 2015-01-27 ENCOUNTER — Other Ambulatory Visit: Payer: Self-pay | Admitting: Cardiovascular Disease

## 2015-02-05 ENCOUNTER — Ambulatory Visit (HOSPITAL_COMMUNITY): Payer: Medicare Other | Attending: Cardiology

## 2015-02-05 ENCOUNTER — Other Ambulatory Visit: Payer: Self-pay

## 2015-02-05 DIAGNOSIS — E785 Hyperlipidemia, unspecified: Secondary | ICD-10-CM | POA: Insufficient documentation

## 2015-02-05 DIAGNOSIS — Z6841 Body Mass Index (BMI) 40.0 and over, adult: Secondary | ICD-10-CM | POA: Insufficient documentation

## 2015-02-05 DIAGNOSIS — Z954 Presence of other heart-valve replacement: Secondary | ICD-10-CM | POA: Diagnosis not present

## 2015-02-05 DIAGNOSIS — I350 Nonrheumatic aortic (valve) stenosis: Secondary | ICD-10-CM | POA: Insufficient documentation

## 2015-02-05 DIAGNOSIS — I10 Essential (primary) hypertension: Secondary | ICD-10-CM | POA: Diagnosis not present

## 2015-02-05 DIAGNOSIS — E119 Type 2 diabetes mellitus without complications: Secondary | ICD-10-CM | POA: Diagnosis not present

## 2015-02-05 DIAGNOSIS — I517 Cardiomegaly: Secondary | ICD-10-CM | POA: Diagnosis not present

## 2015-02-07 ENCOUNTER — Telehealth: Payer: Self-pay

## 2015-02-07 DIAGNOSIS — Z952 Presence of prosthetic heart valve: Secondary | ICD-10-CM

## 2015-02-07 NOTE — Telephone Encounter (Signed)
-----   Message from Runell GessJonathan J Berry, MD sent at 02/05/2015  1:19 PM EST ----- Low normal LV systolic function with normally functioning aortic valve replacement. Repeat in 12 months

## 2015-02-20 ENCOUNTER — Other Ambulatory Visit: Payer: Self-pay | Admitting: Cardiovascular Disease

## 2015-02-20 NOTE — Telephone Encounter (Signed)
Rx request sent to pharmacy.  

## 2015-04-23 ENCOUNTER — Other Ambulatory Visit: Payer: Self-pay | Admitting: Cardiovascular Disease

## 2015-04-23 MED ORDER — EZETIMIBE-SIMVASTATIN 10-20 MG PO TABS: 1.0000 | ORAL_TABLET | Freq: Every day | ORAL | Status: DC

## 2015-04-23 NOTE — Telephone Encounter (Signed)
Rx request sent to pharmacy.  

## 2015-07-05 ENCOUNTER — Other Ambulatory Visit: Payer: Self-pay | Admitting: Cardiovascular Disease

## 2015-07-07 NOTE — Telephone Encounter (Signed)
Rx(s) sent to pharmacy electronically.  

## 2015-08-07 ENCOUNTER — Telehealth: Payer: Self-pay

## 2015-08-07 NOTE — Telephone Encounter (Signed)
Cleared at low CV risk for knee surg  JJB

## 2015-08-07 NOTE — Telephone Encounter (Signed)
1. Type of surgery: Left knee surgery -makoplasty 2. Date of surgery: 08/12/2015 3. Surgeon: Dr Michele McalpinePhil  4. Medications that need to be held & how long: none specified 5. Fax and/or Phone:  Lenda KelpLeAnn Adkins Phone: [phone] Fax: [phone]

## 2015-08-08 ENCOUNTER — Other Ambulatory Visit: Payer: Self-pay

## 2015-08-08 MED ORDER — FUROSEMIDE 40 MG PO TABS: ORAL_TABLET | ORAL | Status: DC

## 2015-08-08 NOTE — Telephone Encounter (Signed)
Clearance routed via Epic 

## 2015-12-02 ENCOUNTER — Other Ambulatory Visit: Payer: Self-pay

## 2015-12-02 MED ORDER — FUROSEMIDE 40 MG PO TABS: ORAL_TABLET | ORAL | 1 refills | Status: DC

## 2015-12-04 ENCOUNTER — Other Ambulatory Visit: Payer: Self-pay

## 2015-12-04 MED ORDER — FUROSEMIDE 40 MG PO TABS: ORAL_TABLET | ORAL | 1 refills | Status: DC

## 2015-12-08 ENCOUNTER — Other Ambulatory Visit: Payer: Self-pay | Admitting: Cardiovascular Disease

## 2015-12-08 NOTE — Telephone Encounter (Signed)
Rx request sent to pharmacy.  

## 2016-01-03 ENCOUNTER — Other Ambulatory Visit: Payer: Self-pay | Admitting: Cardiovascular Disease

## 2016-01-19 ENCOUNTER — Ambulatory Visit (INDEPENDENT_AMBULATORY_CARE_PROVIDER_SITE_OTHER): Payer: Medicare Other | Admitting: Cardiovascular Disease

## 2016-01-19 ENCOUNTER — Encounter: Payer: Self-pay | Admitting: Cardiovascular Disease

## 2016-01-19 VITALS — BP 132/66 | HR 81 | Ht 67.0 in | Wt 252.0 lb

## 2016-01-19 DIAGNOSIS — Z952 Presence of prosthetic heart valve: Secondary | ICD-10-CM | POA: Diagnosis not present

## 2016-01-19 DIAGNOSIS — G4733 Obstructive sleep apnea (adult) (pediatric): Secondary | ICD-10-CM

## 2016-01-19 DIAGNOSIS — I10 Essential (primary) hypertension: Secondary | ICD-10-CM | POA: Diagnosis not present

## 2016-01-19 DIAGNOSIS — E7800 Pure hypercholesterolemia, unspecified: Secondary | ICD-10-CM

## 2016-01-19 NOTE — Progress Notes (Signed)
01/19/2016 Travis [name]   10/01/1945  [id number]  Primary Physician Venia MinksELKINS, DONNAY, NP Primary Cardiologist: Runell GessJonathan J Dustine Bertini MD Roseanne RenoFACP, FACC, FAHA, FSCAI  HPI:  The patient is a 70 year old moderately overweight married Caucasian male with no children who I last saw in the office 01/17/15. He is status post aortic valve replacement by Dr. Kathlee NationsPeter Van Trigt with a Magna Ease #23 pericardial tissue valve for severe aortic stenosis 01/15/09 He had normal coronary arteries at that time with normal LV function.  His other problems include hypertension, hyperlipidemia, and non-insulin requiring diabetes. He has some perioperative atrial fibrillation on amiodarone briefly. He had sternal re-wiring in March of 2011 because of dehiscence but has done well since. He does have obstructive sleep apnea on CPAP. He has been asymptomatic since I last saw him. A 2D echo performed 02/05/15 revealed normal LV systolic function with mild concentric left ventricular hypertrophy and a normally functioning aortic bioprosthesis.   Current Outpatient Prescriptions  Medication Sig Dispense Refill  . aspirin 81 MG tablet Take 81 mg by mouth daily.      Marland Kitchen. ezetimibe-simvastatin (VYTORIN) 10-20 MG tablet Take 1 tablet by mouth at bedtime. 90 tablet 3  . furosemide (LASIX) 40 MG tablet TAKE ONE TABLET BY MOUTH 2 TIMES DAILY 180 tablet 1  . insulin glargine (LANTUS) 100 UNIT/ML injection Inject 60 Units into the skin daily.     Marland Kitchen. losartan-hydrochlorothiazide (HYZAAR) 100-25 MG tablet Take 1 tablet by mouth  daily 30 tablet 11  . metoprolol (TOPROL-XL) 200 MG 24 hr tablet TAKE ONE-HALF TABLET BY  MOUTH DAILY 45 tablet 3  . potassium chloride SA (K-DUR,KLOR-CON) 20 MEQ tablet TAKE 1 TABLET BY MOUTH  DAILY 90 tablet 0   No current facility-administered medications for this visit.     Allergies  Allergen Reactions  . Norvasc [Amlodipine Besylate] Itching  . Penicillins Itching  . Prinivil [Lisinopril] Itching     Social History   Social History  . Marital status: Married    Spouse name: N/A  . Number of children: N/A  . Years of education: N/A   Occupational History  . Not on file.   Social History Main Topics  . Smoking status: Former Smoker    Types: Cigarettes    Quit date: 02/02/1988  . Smokeless tobacco: Never Used  . Alcohol use No  . Drug use: No  . Sexual activity: Not on file   Other Topics Concern  . Not on file   Social History Narrative  . No narrative on file     Review of Systems: General: negative for chills, fever, night sweats or weight changes.  Cardiovascular: negative for chest pain, dyspnea on exertion, edema, orthopnea, palpitations, paroxysmal nocturnal dyspnea or shortness of breath Dermatological: negative for rash Respiratory: negative for cough or wheezing Urologic: negative for hematuria Abdominal: negative for nausea, vomiting, diarrhea, bright red blood per rectum, melena, or hematemesis Neurologic: negative for visual changes, syncope, or dizziness All other systems reviewed and are otherwise negative except as noted above.    Blood pressure 132/66, pulse 81, height 5\' 7"  (1.702 m), weight 252 lb (114.3 kg).  General appearance: alert and no distress Neck: no adenopathy, no carotid bruit, no JVD, supple, symmetrical, trachea midline and thyroid not enlarged, symmetric, no tenderness/mass/nodules Lungs: clear to auscultation bilaterally Heart: regular rate and rhythm, S1, S2 normal, no murmur, click, rub or gallop Extremities: extremities normal, atraumatic, no cyanosis or edema  EKG sinus rhythm at 81  with a nonspecific IVCD and nonspecific ST and T-wave changes. I personally reviewed this EKG  ASSESSMENT AND PLAN:   Status post aortic valve replacement, November 2010, magna ease #23 pericardial tissue valve History of severe aortic stenosis status post bioprosthetic aortic valve replacement by Dr. Donata ClayVan Trigt 01/15/09 with a Magna Ease 23 mm  pericardial tissue valve. His last 2-D echo performed 02/05/15 revealed normal LV systolic function with a well functioning aortic bioprosthesis. We will recheck a 2-D echocardiogram.  Essential hypertension History of hypertension blood pressure measures 132/66. He is on metoprolol, losartan and hydrochlorothiazide. 2. Current meds at current dosing  Hyperlipidemia History of hyperlipidemia on Zetia and simvastatin. We will obtain his most recent lab work from his PCP  Sleep apnea CPAP History of obstructive sleep apnea not currently on CPAP      Runell GessJonathan J. Ramsha Lonigro MD [facility] CenterFACP,FACC,FAHA, [facility]SCAI 01/19/2016 8:26 AM

## 2016-01-19 NOTE — Assessment & Plan Note (Signed)
History of obstructive sleep apnea not currently on CPAP

## 2016-01-19 NOTE — Assessment & Plan Note (Signed)
History of hyperlipidemia on Zetia and simvastatin. We will obtain his most recent lab work from his PCP

## 2016-01-19 NOTE — Patient Instructions (Signed)
Medication Instructions: Your physician recommends that you continue on your current medications as directed. Please refer to the Current Medication list given to you today.   Labwork: I will request labs from your PCP.   Testing/Procedures: Your physician has requested that you have an echocardiogram. Echocardiography is a painless test that uses sound waves to create images of your heart. It provides your doctor with information about the size and shape of your heart and how well your heart's chambers and valves are working. This procedure takes approximately one hour. There are no restrictions for this procedure.  Follow-Up: Your physician wants you to follow-up in: 1 year with Dr. Allyson SabalBerry. You will receive a reminder letter in the mail two months in advance. If you don't receive a letter, please call our office to schedule the follow-up appointment.  If you need a refill on your cardiac medications before your next appointment, please call your pharmacy.

## 2016-01-19 NOTE — Assessment & Plan Note (Signed)
History of severe aortic stenosis status post bioprosthetic aortic valve replacement by Dr. Donata ClayVan Trigt 01/15/09 with a Magna Ease 23 mm pericardial tissue valve. His last 2-D echo performed 02/05/15 revealed normal LV systolic function with a well functioning aortic bioprosthesis. We will recheck a 2-D echocardiogram.

## 2016-01-19 NOTE — Assessment & Plan Note (Signed)
History of hypertension blood pressure measures 132/66. He is on metoprolol, losartan and hydrochlorothiazide. 2. Current meds at current dosing

## 2016-02-06 ENCOUNTER — Ambulatory Visit: Payer: Medicare Other | Admitting: Cardiovascular Disease

## 2016-02-09 ENCOUNTER — Telehealth (HOSPITAL_COMMUNITY): Payer: Self-pay | Admitting: Radiology

## 2016-02-09 NOTE — Telephone Encounter (Signed)
Left patient message regarding late opening of the office and the need to reschedule echocardiogram.

## 2016-02-10 ENCOUNTER — Other Ambulatory Visit (HOSPITAL_COMMUNITY): Payer: Medicare Other

## 2016-02-23 NOTE — Addendum Note (Signed)
Addended by: Alyson InglesBROOME, Alania Overholt L on: 02/23/2016 08:48 AM   Modules accepted: Orders

## 2016-03-01 ENCOUNTER — Other Ambulatory Visit: Payer: Self-pay

## 2016-03-01 ENCOUNTER — Ambulatory Visit (HOSPITAL_COMMUNITY): Payer: Medicare Other | Attending: Cardiology

## 2016-03-01 ENCOUNTER — Encounter (INDEPENDENT_AMBULATORY_CARE_PROVIDER_SITE_OTHER): Payer: Self-pay

## 2016-03-01 DIAGNOSIS — N189 Chronic kidney disease, unspecified: Secondary | ICD-10-CM | POA: Diagnosis not present

## 2016-03-01 DIAGNOSIS — Z952 Presence of prosthetic heart valve: Secondary | ICD-10-CM

## 2016-03-01 DIAGNOSIS — E1122 Type 2 diabetes mellitus with diabetic chronic kidney disease: Secondary | ICD-10-CM | POA: Insufficient documentation

## 2016-03-01 DIAGNOSIS — Z953 Presence of xenogenic heart valve: Secondary | ICD-10-CM | POA: Diagnosis not present

## 2016-03-04 ENCOUNTER — Telehealth: Payer: Self-pay | Admitting: Cardiovascular Disease

## 2016-03-04 ENCOUNTER — Other Ambulatory Visit: Payer: Self-pay | Admitting: Cardiovascular Disease

## 2016-03-04 DIAGNOSIS — I359 Nonrheumatic aortic valve disorder, unspecified: Secondary | ICD-10-CM

## 2016-03-04 NOTE — Telephone Encounter (Signed)
Results given to pt wife--ok per DPR. Pt wife verbalized understanding. Repeat order entered.

## 2016-03-04 NOTE — Telephone Encounter (Signed)
-----   Message from Runell GessJonathan J Berry, MD sent at 03/02/2016  7:40 AM EST ----- Normal LV systolic function with a well functioning aortic bioprosthesis. Repeat echo in 12 months

## 2016-03-12 ENCOUNTER — Other Ambulatory Visit: Payer: Self-pay | Admitting: Cardiovascular Disease

## 2016-03-20 ENCOUNTER — Other Ambulatory Visit: Payer: Self-pay | Admitting: Cardiovascular Disease

## 2016-03-31 ENCOUNTER — Other Ambulatory Visit: Payer: Self-pay | Admitting: Cardiovascular Disease

## 2016-11-21 ENCOUNTER — Other Ambulatory Visit: Payer: Self-pay | Admitting: Cardiovascular Disease

## 2016-11-22 NOTE — Telephone Encounter (Signed)
REFILL 

## 2016-12-14 ENCOUNTER — Other Ambulatory Visit: Payer: Self-pay | Admitting: Cardiovascular Disease

## 2016-12-14 NOTE — Telephone Encounter (Signed)
Rx(s) sent to pharmacy electronically. MD OV 01/12/17

## 2017-01-12 ENCOUNTER — Ambulatory Visit: Payer: Medicare Other | Admitting: Cardiovascular Disease

## 2017-01-14 ENCOUNTER — Other Ambulatory Visit: Payer: Self-pay | Admitting: Cardiovascular Disease

## 2017-01-14 NOTE — Telephone Encounter (Signed)
REFILL 

## 2017-02-09 ENCOUNTER — Encounter: Payer: Self-pay | Admitting: Cardiovascular Disease

## 2017-02-09 ENCOUNTER — Ambulatory Visit: Payer: Medicare Other | Admitting: Cardiovascular Disease

## 2017-02-09 VITALS — BP 130/76 | HR 73 | Ht 67.0 in | Wt 269.0 lb

## 2017-02-09 DIAGNOSIS — R0989 Other specified symptoms and signs involving the circulatory and respiratory systems: Secondary | ICD-10-CM | POA: Diagnosis not present

## 2017-02-09 DIAGNOSIS — Z952 Presence of prosthetic heart valve: Secondary | ICD-10-CM

## 2017-02-09 DIAGNOSIS — E7800 Pure hypercholesterolemia, unspecified: Secondary | ICD-10-CM | POA: Diagnosis not present

## 2017-02-09 DIAGNOSIS — G4730 Sleep apnea, unspecified: Secondary | ICD-10-CM

## 2017-02-09 DIAGNOSIS — I10 Essential (primary) hypertension: Secondary | ICD-10-CM | POA: Diagnosis not present

## 2017-02-09 NOTE — Assessment & Plan Note (Signed)
History of essential hypertension blood pressure measurements at 130/76. He is on losartan, hydrochlorothiazide and metoprolol. Continue current meds at current dosing.

## 2017-02-09 NOTE — Progress Notes (Signed)
02/09/2017 Travis [name]   1945-03-27  [id number]  Primary Physician Patient, No Pcp Per Primary Cardiologist: Runell Gess MD FACP, [place], [place], [place]  HPI:  Travis [name] is a 72 y.o.  moderately overweight married Caucasian male with no children who I last saw in the office 01/19/16. He is status post aortic valve replacement by Dr. Kathlee Nations Trigt with a Magna Ease #23 pericardial tissue valve for severe aortic stenosis 01/15/09 He had normal coronary arteries at that time with normal LV function.  His other problems include hypertension, hyperlipidemia, and non-insulin requiring diabetes. He has some perioperative atrial fibrillation on amiodarone briefly. He had sternal re-wiring in March of 2011 because of dehiscence but has done well since. He does have obstructive sleep apnea on CPAP. He has been asymptomatic since I last saw him. A 2D echo performed 03/01/16  revealed normal LV systolic function with mild concentric left ventricular hypertrophy and a normally functioning aortic bioprosthesis.      Current Meds  Medication Sig  . aspirin 81 MG tablet Take 81 mg by mouth daily.    Marland Kitchen ezetimibe-simvastatin (VYTORIN) 10-20 MG tablet TAKE 1 TABLET BY MOUTH AT  BEDTIME  . furosemide (LASIX) 40 MG tablet TAKE ONE TABLET BY MOUTH 2 TIMES DAILY  . glimepiride (AMARYL) 2 MG tablet Take 2 mg by mouth every morning.  . insulin glargine (LANTUS) 100 UNIT/ML injection Inject 90 Units into the skin daily.   Marland Kitchen losartan-hydrochlorothiazide (HYZAAR) 100-25 MG tablet Take 1 tablet by mouth daily. KEEP OV.  . metFORMIN (GLUCOPHAGE) 500 MG tablet Take 5,090 mg by mouth 2 (two) times daily.  . metoprolol (TOPROL-XL) 200 MG 24 hr tablet Take 0.5 tablets (100 mg total) by mouth daily. KEEP OV.  Marland Kitchen potassium chloride SA (K-DUR,KLOR-CON) 20 MEQ tablet TAKE 1 TABLET BY MOUTH  DAILY  . sitaGLIPtin (JANUVIA) 100 MG tablet Take 50 mg by mouth daily.     Allergies  Allergen Reactions  . Norvasc  [Amlodipine Besylate] Itching  . Penicillins Itching  . Prinivil [Lisinopril] Itching    Social History   Socioeconomic History  . Marital status: Married    Spouse name: Not on file  . Number of children: Not on file  . Years of education: Not on file  . Highest education level: Not on file  Social Needs  . Financial resource strain: Not on file  . Food insecurity - worry: Not on file  . Food insecurity - inability: Not on file  . Transportation needs - medical: Not on file  . Transportation needs - non-medical: Not on file  Occupational History  . Not on file  Tobacco Use  . Smoking status: Former Smoker    Types: Cigarettes    Last attempt to quit: 02/02/1988    Years since quitting: 29.0  . Smokeless tobacco: Never Used  Substance and Sexual Activity  . Alcohol use: No  . Drug use: No  . Sexual activity: Not on file  Other Topics Concern  . Not on file  Social History Narrative  . Not on file     Review of Systems: General: negative for chills, fever, night sweats or weight changes.  Cardiovascular: negative for chest pain, dyspnea on exertion, edema, orthopnea, palpitations, paroxysmal nocturnal dyspnea or shortness of breath Dermatological: negative for rash Respiratory: negative for cough or wheezing Urologic: negative for hematuria Abdominal: negative for nausea, vomiting, diarrhea, bright red blood per rectum, melena, or hematemesis Neurologic: negative for  visual changes, syncope, or dizziness All other systems reviewed and are otherwise negative except as noted above.    Blood pressure 130/76, pulse 73, height 5\' 7"  (1.702 m), weight 269 lb (122 kg).  General appearance: alert and no distress Neck: no adenopathy, no JVD, supple, symmetrical, trachea midline, thyroid not enlarged, symmetric, no tenderness/mass/nodules and Soft left carotid bruit Lungs: clear to auscultation bilaterally Heart: regular rate and rhythm, S1, S2 normal, no murmur, click, rub or  gallop Extremities: extremities normal, atraumatic, no cyanosis or edema Pulses: 2+ and symmetric Skin: Skin color, texture, turgor normal. No rashes or lesions Neurologic: Alert and oriented X 3, normal strength and tone. Normal symmetric reflexes. Normal coordination and gait  EKG sinus rhythm at 73 with PACs. I personally reviewed this EKG.  ASSESSMENT AND PLAN:   Status post aortic valve replacement, November 2010, magna ease #23 pericardial tissue valve History of severe aortic stenosis status post aortic valve replacement by Dr. Kathlee NationsPeter Van Trigt  With a 23 mm Magna Ease pericardial tissue valve 01/15/09. He had normal coronary arteries at the time of. His most recent echo from 03/01/16 revealed normal LV function with a normally functioning aortic bioprosthesis.  Essential hypertension History of essential hypertension blood pressure measurements at 130/76. He is on losartan, hydrochlorothiazide and metoprolol. Continue current meds at current dosing.  Hyperlipidemia History of hyperlipidemia on statin therapy with recent lipid profile performed 10/14/16 revealed a total cholesterol 116, LDL 55 and HDL of 31.  Sleep apnea CPAP History of obstructive sleep apnea not on CPAP.      Runell GessJonathan J. Pegge Cumberledge MD FACP,FACC,FAHA, [facility] [place] [facility] 02/09/2017 10:11 AM

## 2017-02-09 NOTE — Assessment & Plan Note (Signed)
History of severe aortic stenosis status post aortic valve replacement by Dr. Kathlee NationsPeter Van Trigt  With a 23 mm Magna Ease pericardial tissue valve 01/15/09. He had normal coronary arteries at the time of. His most recent echo from 03/01/16 revealed normal LV function with a normally functioning aortic bioprosthesis.

## 2017-02-09 NOTE — Assessment & Plan Note (Signed)
History of hyperlipidemia on statin therapy with recent lipid profile performed 10/14/16 revealed a total cholesterol 116, LDL 55 and HDL of 31.

## 2017-02-09 NOTE — Assessment & Plan Note (Signed)
History of obstructive sleep apnea not on CPAP. 

## 2017-02-09 NOTE — Patient Instructions (Signed)
Medication Instructions: Your physician recommends that you continue on your current medications as directed. Please refer to the Current Medication list given to you today.  Testing/Procedures: Your physician has requested that you have a carotid duplex. This test is an ultrasound of the carotid arteries in your neck. It looks at blood flow through these arteries that supply the brain with blood. Allow one hour for this exam. There are no restrictions or special instructions.  Follow-Up: Your physician wants you to follow-up in: 1 year with Dr. Berry. You will receive a reminder letter in the mail two months in advance. If you don't receive a letter, please call our office to schedule the follow-up appointment.  If you need a refill on your cardiac medications before your next appointment, please call your pharmacy.  

## 2017-02-23 ENCOUNTER — Ambulatory Visit (HOSPITAL_COMMUNITY)
Admission: RE | Admit: 2017-02-23 | Discharge: 2017-02-23 | Disposition: A | Discharge status: Home / Self Care | Payer: Medicare Other | Source: Ambulatory Visit | Attending: Cardiology | Admitting: Cardiology

## 2017-02-23 DIAGNOSIS — R0989 Other specified symptoms and signs involving the circulatory and respiratory systems: Secondary | ICD-10-CM | POA: Diagnosis not present

## 2017-02-23 DIAGNOSIS — I6523 Occlusion and stenosis of bilateral carotid arteries: Secondary | ICD-10-CM | POA: Diagnosis not present

## 2017-03-02 ENCOUNTER — Other Ambulatory Visit: Payer: Self-pay

## 2017-03-02 ENCOUNTER — Ambulatory Visit (HOSPITAL_COMMUNITY): Payer: Medicare Other | Attending: Internal Medicine

## 2017-03-02 DIAGNOSIS — I061 Rheumatic aortic insufficiency: Secondary | ICD-10-CM | POA: Insufficient documentation

## 2017-03-02 DIAGNOSIS — I359 Nonrheumatic aortic valve disorder, unspecified: Secondary | ICD-10-CM

## 2017-03-04 ENCOUNTER — Other Ambulatory Visit: Payer: Self-pay | Admitting: Cardiovascular Disease

## 2017-03-04 NOTE — Telephone Encounter (Signed)
REFILL 

## 2017-03-06 ENCOUNTER — Other Ambulatory Visit: Payer: Self-pay | Admitting: Cardiovascular Disease

## 2017-03-07 NOTE — Telephone Encounter (Signed)
Rx request sent to pharmacy.  

## 2017-03-21 ENCOUNTER — Other Ambulatory Visit: Payer: Self-pay | Admitting: Cardiovascular Disease

## 2017-03-21 NOTE — Telephone Encounter (Signed)
REFILL 

## 2017-04-20 LAB — HEMOGLOBIN A1C: Hemoglobin A1C: 7.5

## 2017-04-24 ENCOUNTER — Other Ambulatory Visit: Payer: Self-pay | Admitting: Cardiovascular Disease

## 2017-07-20 LAB — HEMOGLOBIN A1C: Hemoglobin A1C: 7.4

## 2017-09-09 ENCOUNTER — Other Ambulatory Visit: Payer: Self-pay | Admitting: Cardiovascular Disease

## 2017-09-09 ENCOUNTER — Other Ambulatory Visit: Payer: Self-pay

## 2017-09-09 MED ORDER — LOSARTAN POTASSIUM-HCTZ 100-25 MG PO TABS: 1.0000 | ORAL_TABLET | Freq: Every day | ORAL | 11 refills | Status: DC

## 2017-09-09 MED ORDER — LOSARTAN POTASSIUM-HCTZ 100-25 MG PO TABS: 1.0000 | ORAL_TABLET | Freq: Every day | ORAL | 3 refills | Status: DC

## 2017-09-09 NOTE — Telephone Encounter (Signed)
New Message    *STAT* If patient is at the pharmacy, call can be transferred to refill team.   1. Which medications need to be refilled? (please list name of each medication and dose if known) losartan-hydrochlorothiazide (HYZAAR) 100-25 MG tablet  2. Which pharmacy/location (including street and city if local pharmacy) is medication to be sent to? [id number] - [place], [place] - [address]  3. Do they need a 30 day or 90 day supply? 30

## 2017-09-09 NOTE — Telephone Encounter (Signed)
Med has been refilled.

## 2017-09-09 NOTE — Progress Notes (Signed)
Rx for Hyzaar sent in to the pharmacy.

## 2018-01-20 ENCOUNTER — Other Ambulatory Visit: Payer: Self-pay | Admitting: Cardiovascular Disease

## 2018-02-09 ENCOUNTER — Telehealth: Payer: Self-pay | Admitting: Cardiovascular Disease

## 2018-02-09 MED ORDER — EZETIMIBE-SIMVASTATIN 10-20 MG PO TABS: 1.0000 | ORAL_TABLET | Freq: Every day | ORAL | 0 refills | Status: DC

## 2018-02-09 NOTE — Telephone Encounter (Signed)
Refill sent to the pharmacy electronically.  

## 2018-02-09 NOTE — Telephone Encounter (Signed)
Pt calling requesting a refill on ezetimibe-simvastatin 10-20 mg tablet. Sent to [facility] order pharmacy. Please address

## 2018-03-04 ENCOUNTER — Other Ambulatory Visit: Payer: Self-pay | Admitting: Cardiovascular Disease

## 2018-03-06 NOTE — Telephone Encounter (Signed)
Rx request sent to pharmacy.  

## 2018-03-21 ENCOUNTER — Other Ambulatory Visit: Payer: Self-pay | Admitting: Cardiovascular Disease

## 2018-03-21 NOTE — Telephone Encounter (Signed)
Rx request sent to pharmacy.  

## 2018-03-22 ENCOUNTER — Ambulatory Visit: Payer: Medicare Other | Admitting: Cardiovascular Disease

## 2018-03-22 ENCOUNTER — Encounter: Payer: Self-pay | Admitting: Cardiovascular Disease

## 2018-03-22 DIAGNOSIS — E782 Mixed hyperlipidemia: Secondary | ICD-10-CM | POA: Diagnosis not present

## 2018-03-22 DIAGNOSIS — G4730 Sleep apnea, unspecified: Secondary | ICD-10-CM

## 2018-03-22 DIAGNOSIS — I10 Essential (primary) hypertension: Secondary | ICD-10-CM

## 2018-03-22 DIAGNOSIS — Z952 Presence of prosthetic heart valve: Secondary | ICD-10-CM | POA: Diagnosis not present

## 2018-03-22 NOTE — Patient Instructions (Signed)
Medication Instructions:  Your physician recommends that you continue on your current medications as directed. Please refer to the Current Medication list given to you today.  If you need a refill on your cardiac medications before your next appointment, please call your pharmacy.   Lab work: NONE If you have labs (blood work) drawn today and your tests are completely normal, you will receive your results only by: Marland Kitchen MyChart Message (if you have MyChart) OR . A paper copy in the mail If you have any lab test that is abnormal or we need to change your treatment, we will call you to review the results.  Testing/Procedures: Your physician has requested that you have an echocardiogram. Echocardiography is a painless test that uses sound waves to create images of your heart. It provides your doctor with information about the size and shape of your heart and how well your heart's chambers and valves are working. This procedure takes approximately one hour. There are no restrictions for this procedure.   Follow-Up: At [facility], you and your health needs are our priority.  As part of our continuing mission to provide you with exceptional heart care, we have created designated Provider Care Teams.  These Care Teams include your primary Cardiologist (physician) and Advanced Practice Providers (APPs -  Physician Assistants and Nurse Practitioners) who all work together to provide you with the care you need, when you need it. . You will need a follow up appointment in 12 months.  Please call our office 2 months in advance to schedule this appointment.  You may see Dr. Allyson Sabal or one of the following Advanced Practice Providers on your designated Care Team:   . Corine Shelter, [place] . Azalee Course, PA-C . Micah Flesher, PA-C . Joni Reining, DNP . Theodore Demark, PA-C . Judy Pimple, PA-C . Marjie Skiff, PA-C

## 2018-03-22 NOTE — Progress Notes (Signed)
03/22/2018 Travis [name]   1945/10/30  [id number]  Primary Physician Patient, No Pcp Per Primary Cardiologist: Runell Gess MD FACP, [place], [place], [place]  HPI:  Travis [name] is a 73 y.o.  moderately overweight married Caucasian male with no children who I last sawin the office  02/09/2017. He is status post aortic valve replacement by Dr. Kathlee Nations Trigt with a Magna Ease #23 pericardial tissue valve for severe aortic stenosis12/15/10He had normal coronary arteries at that time with normal LV function.  His other problems include hypertension, hyperlipidemia, and non-insulin requiring diabetes. He has some perioperative atrial fibrillation on amiodarone briefly. He had sternal re-wiring in March of 2011 because of dehiscence but has done well since. He does have obstructive sleep apnea on CPAP.   Since I saw him last he is remained stable.  He denies chest pain or shortness of breath.  His last 2D echo performed 03/02/2017 revealed normal LV systolic function with a well-functioning aortic bioprosthesis.  Current Meds  Medication Sig  . aspirin 81 MG tablet Take 81 mg by mouth daily.    Marland Kitchen ezetimibe-simvastatin (VYTORIN) 10-20 MG tablet Take 1 tablet by mouth at bedtime. Please call for a follow up appointment  . furosemide (LASIX) 40 MG tablet TAKE 1 TABLET BY MOUTH TWO  TIMES DAILY  . glimepiride (AMARYL) 2 MG tablet Take 2 mg by mouth every morning.  . insulin glargine (LANTUS) 100 UNIT/ML injection Inject 90 Units into the skin daily.   Marland Kitchen losartan-hydrochlorothiazide (HYZAAR) 100-25 MG tablet Take 1 tablet by mouth daily.  . metFORMIN (GLUCOPHAGE) 500 MG tablet Take 5,090 mg by mouth 2 (two) times daily.  . metoprolol (TOPROL-XL) 200 MG 24 hr tablet Take 0.5 tablets (100 mg total) by mouth daily. Please schedule appointment for refills.  . potassium chloride SA (K-DUR,KLOR-CON) 20 MEQ tablet Take 1 tablet (20 mEq total) by mouth daily.  . sitaGLIPtin (JANUVIA) 100 MG tablet Take 50  mg by mouth daily.     Allergies  Allergen Reactions  . Norvasc [Amlodipine Besylate] Itching  . Penicillins Itching  . Prinivil [Lisinopril] Itching    Social History   Socioeconomic History  . Marital status: Married    Spouse name: Not on file  . Number of children: Not on file  . Years of education: Not on file  . Highest education level: Not on file  Occupational History  . Not on file  Social Needs  . Financial resource strain: Not on file  . Food insecurity:    Worry: Not on file    Inability: Not on file  . Transportation needs:    Medical: Not on file    Non-medical: Not on file  Tobacco Use  . Smoking status: Former Smoker    Types: Cigarettes    Last attempt to quit: 02/02/1988    Years since quitting: 30.1  . Smokeless tobacco: Never Used  Substance and Sexual Activity  . Alcohol use: No  . Drug use: No  . Sexual activity: Not on file  Lifestyle  . Physical activity:    Days per week: Not on file    Minutes per session: Not on file  . Stress: Not on file  Relationships  . Social connections:    Talks on phone: Not on file    Gets together: Not on file    Attends religious service: Not on file    Active member of club or organization: Not on file  Attends meetings of clubs or organizations: Not on file    Relationship status: Not on file  . Intimate partner violence:    Fear of current or ex partner: Not on file    Emotionally abused: Not on file    Physically abused: Not on file    Forced sexual activity: Not on file  Other Topics Concern  . Not on file  Social History Narrative  . Not on file     Review of Systems: General: negative for chills, fever, night sweats or weight changes.  Cardiovascular: negative for chest pain, dyspnea on exertion, edema, orthopnea, palpitations, paroxysmal nocturnal dyspnea or shortness of breath Dermatological: negative for rash Respiratory: negative for cough or wheezing Urologic: negative for  hematuria Abdominal: negative for nausea, vomiting, diarrhea, bright red blood per rectum, melena, or hematemesis Neurologic: negative for visual changes, syncope, or dizziness All other systems reviewed and are otherwise negative except as noted above.    Blood pressure 128/60, pulse 82, height 5\' 9"  (1.753 m), weight 270 lb (122.5 kg).  General appearance: alert and no distress Neck: no adenopathy, no carotid bruit, no JVD, supple, symmetrical, trachea midline and thyroid not enlarged, symmetric, no tenderness/mass/nodules Lungs: clear to auscultation bilaterally Heart: regular rate and rhythm, S1, S2 normal, no murmur, click, rub or gallop Extremities: Chronic 1-2+ lower extremity edema Pulses: 2+ and symmetric Skin: Skin color, texture, turgor normal. No rashes or lesions Neurologic: Alert and oriented X 3, normal strength and tone. Normal symmetric reflexes. Normal coordination and gait  EKG sinus rhythm at 82 without ST or T wave changes.  I personally reviewed this EKG.  ASSESSMENT AND PLAN:   Status post aortic valve replacement, November 2010, magna ease #23 pericardial tissue valve Status post aortic valve replacement 01/15/2009 by Dr. Maren Beach with a magna ease #23 pericardial tissue valve for severe aortic stenosis.  He did have normal coronary arteries.  Recent 2D echo performed 03/02/2017 revealed normal LV systolic function with a well-functioning aortic bioprosthesis.  He denies chest pain or shortness of breath.  Essential hypertension History of essential hypertension with blood pressure measured today 128/60.  He is on losartan, hydrochlorothiazide and metoprolol.  Hyperlipidemia History of hyperlipidemia on Vytorin followed by his PCP  Sleep apnea CPAP History of of obstructive sleep apnea currently not wearing CPAP      Runell Gess MD [facility], [facility] 03/22/2018 12:30 PM

## 2018-03-22 NOTE — Assessment & Plan Note (Signed)
History of of obstructive sleep apnea currently not wearing CPAP

## 2018-03-22 NOTE — Assessment & Plan Note (Signed)
Status post aortic valve replacement 01/15/2009 by Dr. Maren Beach with a magna ease #23 pericardial tissue valve for severe aortic stenosis.  He did have normal coronary arteries.  Recent 2D echo performed 03/02/2017 revealed normal LV systolic function with a well-functioning aortic bioprosthesis.  He denies chest pain or shortness of breath.

## 2018-03-22 NOTE — Assessment & Plan Note (Signed)
History of essential hypertension with blood pressure measured today 128/60.  He is on losartan, hydrochlorothiazide and metoprolol.

## 2018-03-22 NOTE — Assessment & Plan Note (Signed)
History of hyperlipidemia on Vytorin followed by his PCP 

## 2018-03-29 ENCOUNTER — Ambulatory Visit (HOSPITAL_COMMUNITY): Payer: Medicare Other | Attending: Internal Medicine

## 2018-03-29 DIAGNOSIS — Z952 Presence of prosthetic heart valve: Secondary | ICD-10-CM

## 2018-03-29 MED ORDER — PERFLUTREN LIPID MICROSPHERE
1.0000 mL | INTRAVENOUS | Status: AC | PRN
  Administered 2018-03-29: 2 mL via INTRAVENOUS

## 2018-04-04 ENCOUNTER — Other Ambulatory Visit: Payer: Self-pay | Admitting: Cardiovascular Disease

## 2018-04-04 DIAGNOSIS — Z952 Presence of prosthetic heart valve: Secondary | ICD-10-CM

## 2018-04-12 ENCOUNTER — Telehealth: Payer: Self-pay | Admitting: Cardiovascular Disease

## 2018-04-12 ENCOUNTER — Other Ambulatory Visit: Payer: Self-pay | Admitting: Cardiovascular Disease

## 2018-04-12 NOTE — Telephone Encounter (Signed)
Patient requesting results of Echo. 

## 2018-04-12 NOTE — Telephone Encounter (Signed)
Advised patient, verbalized understanding.   Notes recorded by Runell Gess, MD on 03/29/2018 at 5:21 PM EST Normal LV size and function with a well-functioning aortic bioprosthesis. Repeat 12 months.

## 2018-06-16 ENCOUNTER — Other Ambulatory Visit: Payer: Self-pay | Admitting: Cardiovascular Disease

## 2018-06-16 MED ORDER — METOPROLOL SUCCINATE ER 100 MG PO TB24: 100.0000 mg | ORAL_TABLET | Freq: Every day | ORAL | 3 refills | Status: DC

## 2018-07-10 ENCOUNTER — Other Ambulatory Visit: Payer: Self-pay | Admitting: Cardiovascular Disease

## 2018-07-13 ENCOUNTER — Telehealth: Payer: Self-pay | Admitting: Cardiovascular Disease

## 2018-07-13 NOTE — Telephone Encounter (Signed)
Pt have a year refills send into [facility]

## 2018-07-13 NOTE — Telephone Encounter (Signed)
°*  STAT* If patient is at the pharmacy, call can be transferred to refill team.   1. Which medications need to be refilled? (please list name of each medication and dose if known) metoprolol succinate (TOPROL-XL) 100 MG 24 hr tablet  2. Which pharmacy/location (including street and city if local pharmacy) is medication to be sent to? [place], [place]  3. Do they need a 30 day or 90 day supply? 90 days

## 2018-07-26 ENCOUNTER — Other Ambulatory Visit: Payer: Self-pay | Admitting: Cardiovascular Disease

## 2018-08-29 ENCOUNTER — Other Ambulatory Visit: Payer: Self-pay | Admitting: Cardiovascular Disease

## 2018-09-14 LAB — HEMOGLOBIN A1C: Hemoglobin A1C: 8.4

## 2018-09-22 ENCOUNTER — Other Ambulatory Visit: Payer: Self-pay | Admitting: Cardiovascular Disease

## 2018-10-14 ENCOUNTER — Other Ambulatory Visit: Payer: Self-pay | Admitting: Cardiovascular Disease

## 2018-11-18 ENCOUNTER — Other Ambulatory Visit: Payer: Self-pay | Admitting: Cardiovascular Disease

## 2019-03-13 ENCOUNTER — Telehealth: Payer: Self-pay | Admitting: *Deleted

## 2019-03-13 NOTE — Telephone Encounter (Signed)
A message was left, re: his follow up visit. 

## 2019-03-22 ENCOUNTER — Ambulatory Visit: Payer: Medicare Other | Admitting: Cardiovascular Disease

## 2019-03-28 ENCOUNTER — Other Ambulatory Visit: Payer: Self-pay

## 2019-03-28 ENCOUNTER — Encounter: Payer: Self-pay | Admitting: Cardiovascular Disease

## 2019-03-28 ENCOUNTER — Ambulatory Visit: Payer: Medicare Other | Admitting: Cardiovascular Disease

## 2019-03-28 VITALS — BP 128/78 | HR 77 | Temp 98.0°F | Ht 69.0 in | Wt 265.4 lb

## 2019-03-28 DIAGNOSIS — I10 Essential (primary) hypertension: Secondary | ICD-10-CM | POA: Diagnosis not present

## 2019-03-28 DIAGNOSIS — G4730 Sleep apnea, unspecified: Secondary | ICD-10-CM

## 2019-03-28 DIAGNOSIS — Z952 Presence of prosthetic heart valve: Secondary | ICD-10-CM

## 2019-03-28 DIAGNOSIS — E782 Mixed hyperlipidemia: Secondary | ICD-10-CM | POA: Diagnosis not present

## 2019-03-28 NOTE — Assessment & Plan Note (Signed)
History of bioprosthetic AVR by Dr. Maren Beach 01/15/2009 with a 23 mm pericardial tissue valve (magna ease).  Prior to that revealed normal coronary arteries and normal LV function.  Last echo performed 03/29/2018 revealed normal LV systolic function with a well-functioning aortic bioprosthesis.  Patient is asymptomatic.  We will we will recheck a 2D echocardiogram.

## 2019-03-28 NOTE — Progress Notes (Signed)
03/28/2019 Travis [name]   10/24/1945  [id number]  Primary Physician Patient, No Pcp Per Primary Cardiologist: Runell Gess MD FACP, [place], [place], [place]  HPI:  Travis [name] is a 74 y.o.  moderately overweight married Caucasian male with no children who I last sawin the office  03/22/2018. He is status post aortic valve replacement by Dr. Kathlee Nations Trigt with a Magna Ease #23 pericardial tissue valve for severe aortic stenosis12/15/10He had normal coronary arteries at that time with normal LV function.  His other problems include hypertension, hyperlipidemia, and non-insulin requiring diabetes. He has some perioperative atrial fibrillation on amiodarone briefly. He had sternal re-wiring in March of 2011 because of dehiscence but has done well since. He does have obstructive sleep apnea on CPAP.   Since I saw him last a year ago he has remained stable.  He denies chest pain or shortness of breath.  His last 2D echo performed 03/29/2018 revealed normal LV systolic function with a well-functioning aortic bioprosthesis.    Current Meds  Medication Sig  . aspirin 81 MG tablet Take 81 mg by mouth daily.    Marland Kitchen ezetimibe-simvastatin (VYTORIN) 10-20 MG tablet TAKE 1 TABLET BY MOUTH AT  BEDTIME.  . furosemide (LASIX) 40 MG tablet TAKE 1 TABLET BY MOUTH TWO  TIMES DAILY  . glimepiride (AMARYL) 2 MG tablet Take 2 mg by mouth every morning.  . insulin glargine (LANTUS) 100 UNIT/ML injection Inject 90 Units into the skin daily.   Marland Kitchen losartan (COZAAR) 100 MG tablet TAKE 1 TABLET (100 MG TOTAL) BY MOUTH DAILY. OFFICE VISIT NEEDED  . losartan-hydrochlorothiazide (HYZAAR) 100-25 MG tablet Take 1 tablet by mouth daily.  . metFORMIN (GLUCOPHAGE) 500 MG tablet Take 5,090 mg by mouth 2 (two) times daily.  . metoprolol succinate (TOPROL-XL) 100 MG 24 hr tablet Take 1 tablet (100 mg total) by mouth daily. Take with or immediately following a meal.  . Omega-3 Fatty Acids (FISH OIL) 1000 MG CAPS Take by  mouth.  . potassium chloride SA (K-DUR,KLOR-CON) 20 MEQ tablet Take 1 tablet (20 mEq total) by mouth daily.  . sitaGLIPtin (JANUVIA) 100 MG tablet Take 50 mg by mouth daily.  . [DISCONTINUED] hydrochlorothiazide (HYDRODIURIL) 25 MG tablet TAKE 1 TABLET BY MOUTH EVERY DAY     Allergies  Allergen Reactions  . Norvasc [Amlodipine Besylate] Itching  . Penicillins Itching  . Prinivil [Lisinopril] Itching    Social History   Socioeconomic History  . Marital status: Married    Spouse name: Not on file  . Number of children: Not on file  . Years of education: Not on file  . Highest education level: Not on file  Occupational History  . Not on file  Tobacco Use  . Smoking status: Former Smoker    Types: Cigarettes    Quit date: 02/02/1988    Years since quitting: 31.1  . Smokeless tobacco: Never Used  Substance and Sexual Activity  . Alcohol use: No  . Drug use: No  . Sexual activity: Not on file  Other Topics Concern  . Not on file  Social History Narrative  . Not on file   Social Determinants of Health   Financial Resource Strain:   . Difficulty of Paying Living Expenses: Not on file  Food Insecurity:   . Worried About Programme researcher, broadcasting/film/video in the Last Year: Not on file  . Ran Out of Food in the Last Year: Not on file  Transportation Needs:   .  Lack of Transportation (Medical): Not on file  . Lack of Transportation (Non-Medical): Not on file  Physical Activity:   . Days of Exercise per Week: Not on file  . Minutes of Exercise per Session: Not on file  Stress:   . Feeling of Stress : Not on file  Social Connections:   . Frequency of Communication with Friends and Family: Not on file  . Frequency of Social Gatherings with Friends and Family: Not on file  . Attends Religious Services: Not on file  . Active Member of Clubs or Organizations: Not on file  . Attends Archivist Meetings: Not on file  . Marital Status: Not on file  Intimate Partner Violence:   . Fear  of Current or Ex-Partner: Not on file  . Emotionally Abused: Not on file  . Physically Abused: Not on file  . Sexually Abused: Not on file     Review of Systems: General: negative for chills, fever, night sweats or weight changes.  Cardiovascular: negative for chest pain, dyspnea on exertion, edema, orthopnea, palpitations, paroxysmal nocturnal dyspnea or shortness of breath Dermatological: negative for rash Respiratory: negative for cough or wheezing Urologic: negative for hematuria Abdominal: negative for nausea, vomiting, diarrhea, bright red blood per rectum, melena, or hematemesis Neurologic: negative for visual changes, syncope, or dizziness All other systems reviewed and are otherwise negative except as noted above.    Blood pressure 128/78, pulse 77, temperature 98 F (36.7 C), height 5\' 9"  (1.753 m), weight 265 lb 6.4 oz (120.4 kg), SpO2 97 %.  General appearance: alert and no distress Neck: no adenopathy, no carotid bruit, no JVD, supple, symmetrical, trachea midline and thyroid not enlarged, symmetric, no tenderness/mass/nodules Lungs: clear to auscultation bilaterally Heart: 2/6 outflow tract murmur consistent with aortic stenosis Extremities: extremities normal, atraumatic, no cyanosis or edema Pulses: 2+ and symmetric Skin: Venous stasis changes bilaterally Neurologic: Alert and oriented X 3, normal strength and tone. Normal symmetric reflexes. Normal coordination and gait  EKG normal sinus rhythm at 76 with nonspecific ST and T wave changes and occasional PVCs.  I personally reviewed this EKG.  ASSESSMENT AND PLAN:   Status post aortic valve replacement, November 2010, magna ease #23 pericardial tissue valve History of bioprosthetic AVR by Dr. Darcey Nora 01/15/2009 with a 23 mm pericardial tissue valve (magna ease).  Prior to that revealed normal coronary arteries and normal LV function.  Last echo performed 03/29/2018 revealed normal LV systolic function with a  well-functioning aortic bioprosthesis.  Patient is asymptomatic.  We will we will recheck a 2D echocardiogram.  Essential hypertension History of essential hypertension blood pressure measured today at 128/78.  He is on losartan and metoprolol.  Hyperlipidemia History of hyperlipidemia on Vytorin followed by his PCP  Sleep apnea CPAP History of obstructive sleep apnea on CPAP      Lorretta Harp MD [facility], [facility] 03/28/2019 9:24 AM

## 2019-03-28 NOTE — Assessment & Plan Note (Signed)
History of essential hypertension blood pressure measured today at 128/78.  He is on losartan and metoprolol.

## 2019-03-28 NOTE — Assessment & Plan Note (Signed)
History of obstructive sleep apnea on CPAP. 

## 2019-03-28 NOTE — Patient Instructions (Signed)
Medication Instructions:  Your physician recommends that you continue on your current medications as directed. Please refer to the Current Medication list given to you today.  If you need a refill on your cardiac medications before your next appointment, please call your pharmacy.   Lab work: NONE  Testing/Procedures: Your physician has requested that you have an echocardiogram. Echocardiography is a painless test that uses sound waves to create images of your heart. It provides your doctor with information about the size and shape of your heart and how well your heart's chambers and valves are working. This procedure takes approximately one hour. There are no restrictions for this procedure. [address]  Follow-Up: At [facility], you and your health needs are our priority.  As part of our continuing mission to provide you with exceptional heart care, we have created designated Provider Care Teams.  These Care Teams include your primary Cardiologist (physician) and Advanced Practice Providers (APPs -  Physician Assistants and Nurse Practitioners) who all work together to provide you with the care you need, when you need it. You may see Nanetta Batty, MD or one of the following Advanced Practice Providers on your designated Care Team:    Corine Shelter, PA-C  [place], [place]  Edd Fabian, [place]  Your physician wants you to follow-up in: 1 year with Dr. Allyson Sabal. You will receive a reminder letter in the mail two months in advance. If you don't receive a letter, please call our office to schedule the follow-up appointment.

## 2019-03-28 NOTE — Assessment & Plan Note (Signed)
History of hyperlipidemia on Vytorin followed by his PCP 

## 2019-04-10 ENCOUNTER — Other Ambulatory Visit: Payer: Self-pay | Admitting: Cardiovascular Disease

## 2019-04-18 ENCOUNTER — Ambulatory Visit (HOSPITAL_COMMUNITY): Payer: Medicare Other | Attending: Internal Medicine

## 2019-04-18 ENCOUNTER — Other Ambulatory Visit: Payer: Self-pay

## 2019-04-18 DIAGNOSIS — Z952 Presence of prosthetic heart valve: Secondary | ICD-10-CM | POA: Insufficient documentation

## 2019-04-18 DIAGNOSIS — I10 Essential (primary) hypertension: Secondary | ICD-10-CM | POA: Diagnosis present

## 2019-04-18 MED ORDER — PERFLUTREN LIPID MICROSPHERE
1.0000 mL | INTRAVENOUS | Status: AC | PRN
  Administered 2019-04-18: 2 mL via INTRAVENOUS

## 2019-04-19 ENCOUNTER — Telehealth: Payer: Self-pay

## 2019-04-19 ENCOUNTER — Other Ambulatory Visit: Payer: Self-pay

## 2019-04-19 DIAGNOSIS — Z952 Presence of prosthetic heart valve: Secondary | ICD-10-CM

## 2019-04-19 NOTE — Telephone Encounter (Signed)
Spoke to patient echo results given.Stated he was concerned when he had echo done yesterday echo tech checked B/P, he cannot remember the reading but she said it was alittle high.Stated the last time he saw Dr.Berry B/P was good.Advised patient to monitor B/P at home and if B/P elevated to call back.

## 2019-05-11 ENCOUNTER — Other Ambulatory Visit: Payer: Self-pay | Admitting: Cardiovascular Disease

## 2019-05-11 LAB — HEMOGLOBIN A1C
Hemoglobin A1C: 7.4
Hemoglobin A1C: 8.4

## 2019-05-16 ENCOUNTER — Other Ambulatory Visit: Payer: Self-pay | Admitting: Cardiovascular Disease

## 2019-05-26 ENCOUNTER — Other Ambulatory Visit: Payer: Self-pay | Admitting: Cardiovascular Disease

## 2019-06-20 ENCOUNTER — Other Ambulatory Visit: Payer: Self-pay

## 2019-06-20 MED ORDER — LOSARTAN POTASSIUM 100 MG PO TABS: 100.0000 mg | ORAL_TABLET | Freq: Every day | ORAL | 2 refills | Status: DC

## 2019-08-17 ENCOUNTER — Other Ambulatory Visit: Payer: Self-pay | Admitting: Cardiovascular Disease

## 2019-08-30 LAB — HEMOGLOBIN A1C: Hemoglobin A1C: 7.3

## 2019-12-19 ENCOUNTER — Other Ambulatory Visit: Payer: Self-pay | Admitting: Cardiovascular Disease

## 2020-01-04 DIAGNOSIS — D72829 Elevated white blood cell count, unspecified: Secondary | ICD-10-CM | POA: Insufficient documentation

## 2020-01-04 LAB — HEMOGLOBIN A1C: Hemoglobin A1C: 6.5

## 2020-02-07 ENCOUNTER — Other Ambulatory Visit: Payer: Self-pay

## 2020-02-07 ENCOUNTER — Ambulatory Visit (INDEPENDENT_AMBULATORY_CARE_PROVIDER_SITE_OTHER): Payer: Medicare Other | Admitting: Family Medicine

## 2020-02-07 ENCOUNTER — Encounter: Payer: Self-pay | Admitting: Family Medicine

## 2020-02-07 VITALS — BP 188/79 | HR 80 | Temp 97.8°F | Wt 269.0 lb

## 2020-02-07 DIAGNOSIS — R251 Tremor, unspecified: Secondary | ICD-10-CM

## 2020-02-07 DIAGNOSIS — I10 Essential (primary) hypertension: Secondary | ICD-10-CM

## 2020-02-07 DIAGNOSIS — R413 Other amnesia: Secondary | ICD-10-CM | POA: Diagnosis not present

## 2020-02-07 DIAGNOSIS — E1169 Type 2 diabetes mellitus with other specified complication: Secondary | ICD-10-CM | POA: Diagnosis not present

## 2020-02-07 DIAGNOSIS — E669 Obesity, unspecified: Secondary | ICD-10-CM

## 2020-02-07 DIAGNOSIS — E782 Mixed hyperlipidemia: Secondary | ICD-10-CM

## 2020-02-07 NOTE — Assessment & Plan Note (Signed)
His diabetes has been well controlled.  He has had a couple of episodes of hypoglycemia and we discussed that if this is a recurrent issue I recommend that we stop his glimepiride.  He will continue to monitor blood sugars closely at home.

## 2020-02-07 NOTE — Progress Notes (Signed)
Travis [name] - 75 y.o. male MRN [id number]  Date of birth: 10-19-1945  Subjective Chief Complaint  Patient presents with  . Headache  . Dementia  . Suspected Dementia  . Diabetes  . Nevus    Head     HPI Travis [name] is a 75 year old male here today for initial visit.  He has a history of hypertension, type 2 diabetes, hyperlipidemia, OSA and history of aortic valve replacement.  He and his wife's main concern today is possible dementia.  His diabetes has been well controlled, A1c on January 04, 2020 was 6.5%.  Currently managed with Januvia, metformin, glimepiride and Lantus.  Currently taking 90 units of Lantus each day.  He has had a couple of episodes of hypoglycemia with glucose in the 40's.  Hypertension is currently treated with metoprolol, losartan and furosemide.  He is tolerating this well.  Blood pressure is elevated today.  Readings at home have been pretty well controlled.  He denies symptoms including chest pain, shortness of breath, palpitation,  or vision changes.  The principal concern today is possible dementia.  His wife reports that a few weeks ago he noted that both of their dogs and started driving.  He thought he was going to his parents house who have been deceased for several years.  He was found in [place] and had no idea how he ended up there.  Wife reports that memory issues have been worsening over the past several months.  She is also noted that he has had increasing tremor at rest.  He does admit to some depressive symptoms but he feels like this is related to uncertainty of what is going on.  He also reports some increased frequency of headaches over the past several weeks.  No flowsheet data found.  No flowsheet data found.   ROS:  A comprehensive ROS was completed and negative except as noted per HPI  Allergies  Allergen Reactions  . Norvasc [Amlodipine Besylate] Itching  . Penicillins Itching  . Prinivil [Lisinopril]  Itching    Past Medical History:  Diagnosis Date  . Aortic stenosis    AVR 01/2009 with a Magna Ease #23 pericardial tissue valve; echo 07/22/11-EF 50-55%, aortic valve is well seated  . Chest pain    myoview 11-14-2008- normal study  . Diabetes mellitus type 2 in obese (HCC)   . HTN (hypertension)   . Hyperlipemia   . Obesity   . OSA (obstructive sleep apnea)   . PAF (paroxysmal atrial fibrillation) (HCC)    on amiodarone briefly  . Sleep apnea     Past Surgical History:  Procedure Laterality Date  . AORTIC VALVE REPLACEMENT  01/15/2009   AVR with a 39mm Magna Ease valve [id number], model 3300TFX  . CARDIAC CATHETERIZATION  12/12/08   normal coronaries, nl EF, severe AS  . 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   fibrous malunion of sternotomy     Social History   Socioeconomic History  . Marital status: Married    Spouse name: Not on file  . Number of children: Not on file  . Years of education: Not on file  . Highest education level: Not on file  Occupational History  . Not on file  Tobacco Use  . Smoking status: Former Smoker    Types: Cigarettes    Quit date: 02/02/1988    Years since quitting: 32.0  . Smokeless tobacco: Never Used  Substance and Sexual Activity  . Alcohol use: No  .  Drug use: No  . Sexual activity: Not on file  Other Topics Concern  . Not on file  Social History Narrative  . Not on file   Social Determinants of Health   Financial Resource Strain: Not on file  Food Insecurity: Not on file  Transportation Needs: Not on file  Physical Activity: Not on file  Stress: Not on file  Social Connections: Not on file    Family History  Problem Relation Age of Onset  . Cancer Mother   . Diabetes Brother     Health Maintenance  Topic Date Due  . Hepatitis C Screening  Never done  . FOOT EXAM  Never done  . OPHTHALMOLOGY EXAM  Never done  . HEMOGLOBIN A1C  07/04/2020  . TETANUS/TDAP  10/14/2025  . INFLUENZA VACCINE  Completed  . COVID-19 Vaccine   Completed  . PNA vac Low Risk Adult  Completed     ----------------------------------------------------------------------------------------------------------------------------------------------------------------------------------------------------------------- Physical Exam BP (!) 188/79 (BP Location: Left Arm, Patient Position: Sitting, Cuff Size: Large)   Pulse 80   Temp 97.8 F (36.6 C)   Wt 269 lb (122 kg)   SpO2 94%   BMI 39.72 kg/m   Physical Exam Constitutional:      Appearance: He is well-developed.  HENT:     Head: Normocephalic and atraumatic.  Eyes:     General: No scleral icterus. Cardiovascular:     Rate and Rhythm: Normal rate and regular rhythm.  Pulmonary:     Effort: Pulmonary effort is normal.     Breath sounds: Normal breath sounds.  Musculoskeletal:     Cervical back: Normal range of motion.  Neurological:     General: No focal deficit present.     Mental Status: He is alert.  Psychiatric:        Mood and Affect: Mood normal.        Behavior: Behavior normal.     ------------------------------------------------------------------------------------------------------------------------------------------------------------------------------------------------------------------- Assessment and Plan  Diabetes mellitus type 2 in obese His diabetes has been well controlled.  He has had a couple of episodes of hypoglycemia and we discussed that if this is a recurrent issue I recommend that we stop his glimepiride.  He will continue to monitor blood sugars closely at home.  Hyperlipidemia Tolerating Vytorin well.  He will continue at current dose.  Essential hypertension Blood pressure elevated today in clinic however readings at home have been better controlled.  He will continue to monitor and we will reassess this at his follow-up in 3 weeks.  Memory loss Labs ordered for assessment of reversible causes of dementia. He has had some increasing headaches  and have ordered an MRI of his brain as well. Resting tremor noted, no significant stiffness or shuffling gait.  Referral placed to neurology He will return in approximately 3 weeks for MMSE   No orders of the defined types were placed in this encounter.   Return in about 3 weeks (around 02/28/2020) for F/u memory.    This visit occurred during the SARS-CoV-2 public health emergency.  Safety protocols were in place, including screening questions prior to the visit, additional usage of staff PPE, and extensive cleaning of exam room while observing appropriate contact time as indicated for disinfecting solutions.

## 2020-02-07 NOTE — Assessment & Plan Note (Signed)
Blood pressure elevated today in clinic however readings at home have been better controlled.  He will continue to monitor and we will reassess this at his follow-up in 3 weeks.

## 2020-02-07 NOTE — Patient Instructions (Addendum)
Great to meet you today! Have labs completed today. You will be contacted to arrange MRI See me again in about 3 weeks.

## 2020-02-07 NOTE — Assessment & Plan Note (Signed)
Labs ordered for assessment of reversible causes of dementia. He has had some increasing headaches and have ordered an MRI of his brain as well. Resting tremor noted, no significant stiffness or shuffling gait.  Referral placed to neurology He will return in approximately 3 weeks for MMSE

## 2020-02-07 NOTE — Assessment & Plan Note (Signed)
Tolerating Vytorin well.  He will continue at current dose.

## 2020-02-08 LAB — COMPLETE METABOLIC PANEL WITH GFR
AG Ratio: 1.6 (calc) (ref 1.0–2.5)
ALT: 20 U/L (ref 9–46)
AST: 19 U/L (ref 10–35)
Albumin: 4 g/dL (ref 3.6–5.1)
Alkaline phosphatase (APISO): 61 U/L (ref 35–144)
BUN/Creatinine Ratio: 10 (calc) (ref 6–22)
BUN: 18 mg/dL (ref 7–25)
CO2: 26 mmol/L (ref 20–32)
Calcium: 8.8 mg/dL (ref 8.6–10.3)
Chloride: 107 mmol/L (ref 98–110)
Creat: 1.75 mg/dL — ABNORMAL HIGH (ref 0.70–1.18)
GFR, Est African American: 43 mL/min/{1.73_m2} — ABNORMAL LOW (ref >=60)
GFR, Est Non African American: 37 mL/min/{1.73_m2} — ABNORMAL LOW (ref >=60)
Globulin: 2.5 g/dL (calc) (ref 1.9–3.7)
Glucose, Bld: 191 mg/dL — ABNORMAL HIGH (ref 65–139)
Potassium: 4.3 mmol/L (ref 3.5–5.3)
Sodium: 140 mmol/L (ref 135–146)
Total Bilirubin: 0.4 mg/dL (ref 0.2–1.2)
Total Protein: 6.5 g/dL (ref 6.1–8.1)

## 2020-02-08 LAB — RPR: RPR Ser Ql: NONREACTIVE

## 2020-02-08 LAB — TSH: TSH: 1.46 mIU/L (ref 0.40–4.50)

## 2020-02-08 LAB — VITAMIN B12: Vitamin B-12: 303 pg/mL (ref 200–1100)

## 2020-02-11 ENCOUNTER — Other Ambulatory Visit: Payer: Self-pay

## 2020-02-11 ENCOUNTER — Other Ambulatory Visit: Payer: Self-pay | Admitting: Family Medicine

## 2020-02-11 ENCOUNTER — Ambulatory Visit: Payer: Medicare Other

## 2020-02-11 ENCOUNTER — Ambulatory Visit (INDEPENDENT_AMBULATORY_CARE_PROVIDER_SITE_OTHER): Payer: Medicare Other

## 2020-02-11 ENCOUNTER — Telehealth: Payer: Self-pay

## 2020-02-11 DIAGNOSIS — R251 Tremor, unspecified: Secondary | ICD-10-CM | POA: Diagnosis not present

## 2020-02-11 DIAGNOSIS — Z1389 Encounter for screening for other disorder: Secondary | ICD-10-CM

## 2020-02-11 DIAGNOSIS — R413 Other amnesia: Secondary | ICD-10-CM | POA: Diagnosis not present

## 2020-02-11 DIAGNOSIS — Z0189 Encounter for other specified special examinations: Secondary | ICD-10-CM

## 2020-02-11 NOTE — Telephone Encounter (Signed)
Thayer Ohm, Bill's wife, called and left a message stating he is still confused. He is now stating he doesn't recognize the house as his house.   I did send a message to the imaging department in hopes to get his MRI scheduled soon.

## 2020-02-11 NOTE — Telephone Encounter (Signed)
MRI appt noted.   Cindy-Can we see if neuro may be able to get him in any quicker?  Thanks!

## 2020-02-13 ENCOUNTER — Ambulatory Visit: Payer: Medicare Other | Admitting: Family Medicine

## 2020-02-18 ENCOUNTER — Ambulatory Visit: Payer: Medicare Other | Admitting: Neurology

## 2020-02-19 ENCOUNTER — Telehealth: Payer: Self-pay

## 2020-02-19 NOTE — Telephone Encounter (Signed)
I called patient.  I rescheduled his appointment due to the weather.  He is agreeable to an appointment tomorrow January 19 at 8 AM check-in at 7:30.

## 2020-02-20 ENCOUNTER — Encounter: Payer: Self-pay | Admitting: Neurology

## 2020-02-20 ENCOUNTER — Ambulatory Visit (INDEPENDENT_AMBULATORY_CARE_PROVIDER_SITE_OTHER): Payer: Medicare Other | Admitting: Neurology

## 2020-02-20 VITALS — BP 156/84 | HR 81 | Ht 68.0 in | Wt 264.3 lb

## 2020-02-20 DIAGNOSIS — R600 Localized edema: Secondary | ICD-10-CM

## 2020-02-20 DIAGNOSIS — Z820 Family history of epilepsy and other diseases of the nervous system: Secondary | ICD-10-CM | POA: Diagnosis not present

## 2020-02-20 DIAGNOSIS — R410 Disorientation, unspecified: Secondary | ICD-10-CM

## 2020-02-20 DIAGNOSIS — Z8669 Personal history of other diseases of the nervous system and sense organs: Secondary | ICD-10-CM

## 2020-02-20 DIAGNOSIS — R413 Other amnesia: Secondary | ICD-10-CM

## 2020-02-20 NOTE — Progress Notes (Signed)
Subjective:    Patient ID: Travis [name] is a 75 y.o. male.  HPI     Huston FoleySaima Jayli Fogleman, MD, PhD [facility] [address], [address] [address] [place], [place] [id number]  Dear Dr. Ashley RoyaltyMatthews,  I saw your patient, Travis [name], upon your kind request in my Neurologic clinic today for initial consultation of his memory loss.  The patient is accompanied by his wife today.  As you know, Travis [name] is a 75 year old right-handed gentleman with an underlying medical history of hypertension, hyperlipidemia, aortic stenosis, sleep apnea, paroxysmal A. fib, diabetes, and obesity, who reports short-term memory issues for the past 2 to 3 months.  His wife reports that in the past month his symptoms have become worse, he has had episodes of confusion and disorientation, is not easy to redirect, sometimes does not seem to recognize her.  He has had episodes of hypoglycemia, just yesterday his blood sugar went down to 21 but she was able to let him drink something orally to get it back up.  Sometimes he seems confused all day.  She has not noticed any twitching or convulsions or staring spells.  He does have an intermittent hand tremor.  He also reports intermittent headaches which are global, short-lived typically.  He reports that his sister had Alzheimer's dementia, she lived to be into her 7180s.  He is the youngest of 4 altogether.  He had 2 sisters and 1 brother, 1 sister passed away and 1 brother passed away.  Brother had heart disease.  He is retired as a [employer]ic, worked in his own business for 40 years.  [place].  He quit smoking 30+ years ago and drinks alcohol very rarely.  Lately he is trying to increase his water intake and he has reduced his diet soda intake but he prefers diet soda.  He drinks 1 cup of coffee per day on average.  Since his incident of driving confused and landing in [place] almost in [place], he has not driven.  Of note, he is medical history lists  obstructive sleep apnea or sleep apnea.  He was diagnosed over 40 years ago and had a CPAP machine for a few months but has not used his machine in decades.  He has not had a reevaluation in many years, probably none since his original diagnosis.  Wife reports that someone told him many years ago that he does not have obstructive sleep apnea.  He no longer has a CPAP machine, gave it away.  He is not willing to pursue another sleep study at this time.  He feels that it would be a waste of time.  His wife is not sure that he would be using a CPAP as he would probably take it off she explains.  She is interested in learning more about alternative treatment options and we talked about surgical options including inspire but I also explained that most insurances do not pay for any alternative options until CPAP is tried first.  I reviewed your office note from 02/07/2020.  His wife reported that the time that he had driven off and did not have recollection of how he ended up away from home that far.  You ordered a brain MRI.  He had a brain MRI without contrast on 02/11/2020 and I reviewed the results: IMPRESSION: 1. No acute intracranial abnormality. 2. 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 3. Moderate parenchymal volume loss and  chronic small vessel ischemia. Of note, he went to the emergency room on 02/18/2020 for hypoglycemia and headache. He went to [facility] health emergency room.  I reviewed the records.  His wife had noticed confusion all day.  He was not acting himself, he was unable to drink orange juice at home so she called 911.  Upon EMS arrival, blood sugar value was 32.  He was given D50 en route and again in the ER.  He had a head CT without contrast through [facility] on 02/18/2020 and I reviewed the results:  No acute intracranial abnormality.   Age-appropriate involutional and ischemic gliotic changes.  There were advised to cut his Lantus insulin in  half at night.  Note, he has not been using his CPAP for about a year.  He reports that he could not tolerate the mask.  His Past Medical History Is Significant For: Past Medical History:  Diagnosis Date  . Aortic stenosis    AVR 01/2009 with a Magna Ease #23 pericardial tissue valve; echo 07/22/11-EF 50-55%, aortic valve is well seated  . Chest pain    myoview 11-14-2008- normal study  . Diabetes mellitus type 2 in obese (HCC)   . HTN (hypertension)   . Hyperlipemia   . Obesity   . OSA (obstructive sleep apnea)   . PAF (paroxysmal atrial fibrillation) (HCC)    on amiodarone briefly  . Sleep apnea     His Past Surgical History Is Significant For: Past Surgical History:  Procedure Laterality Date  . AORTIC VALVE REPLACEMENT  01/15/2009   AVR with a 9mm Magna Ease valve [id number], model 3300TFX  . CARDIAC CATHETERIZATION  12/12/08   normal coronaries, nl EF, severe AS  . 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   fibrous malunion of sternotomy     His Family History Is Significant For: Family History  Problem Relation Age of Onset  . Cancer Mother   . Diabetes Brother     His Social History Is Significant For: Social History   Socioeconomic History  . Marital status: Married    Spouse name: Not on file  . Number of children: Not on file  . Years of education: Not on file  . Highest education level: Not on file  Occupational History  . Not on file  Tobacco Use  . Smoking status: Former Smoker    Types: Cigarettes    Quit date: 02/02/1988    Years since quitting: 32.0  . Smokeless tobacco: Never Used  Substance and Sexual Activity  . Alcohol use: No  . Drug use: No  . Sexual activity: Not on file  Other Topics Concern  . Not on file  Social History Narrative  . Not on file   Social Determinants of Health   Financial Resource Strain: Not on file  Food Insecurity: Not on file  Transportation Needs: Not on file  Physical Activity: Not on file  Stress: Not on file   Social Connections: Not on file    His Allergies Are:  Allergies  Allergen Reactions  . Norvasc [Amlodipine Besylate] Itching  . Penicillins Itching  . Prinivil [Lisinopril] Itching  :   His Current Medications Are:  Outpatient Encounter Medications as of 02/20/2020  Medication Sig  . Acetaminophen (TYLENOL PO) Take by mouth.  Marland Kitchen aspirin 81 MG tablet Take 81 mg by mouth daily.  Marland Kitchen ezetimibe-simvastatin (VYTORIN) 10-20 MG tablet TAKE 1 TABLET BY MOUTH AT  BEDTIME  . furosemide (LASIX) 40 MG tablet TAKE 1 TABLET  BY MOUTH  TWICE DAILY  . glimepiride (AMARYL) 2 MG tablet Take 2 mg by mouth every morning.  Marland Kitchen. glucose blood (ACCU-CHEK AVIVA PLUS) test strip TEST BLOOD SUGAR 1 TO 2 TIMES DAILY AS DIRECTED  . Ibuprofen (MOTRIN PO) Take by mouth.  . insulin glargine (LANTUS) 100 UNIT/ML injection Inject 90 Units into the skin daily.  . Insulin Pen Needle (B-D ULTRAFINE III SHORT PEN) 31G X 8 MM MISC USE TO INJECT ONCE DAILY AS DIRECTED  . losartan (COZAAR) 100 MG tablet Take 1 tablet (100 mg total) by mouth daily.  . metFORMIN (GLUCOPHAGE) 500 MG tablet Take 500 mg by mouth 2 (two) times daily.  . metoprolol succinate (TOPROL-XL) 100 MG 24 hr tablet TAKE 1 TABLET BY MOUTH  DAILY WITH OR IMMEDIATELY  FOLLOWING A MEAL  . Omega-3 Fatty Acids (FISH OIL) 1000 MG CAPS Take by mouth.  . potassium chloride SA (KLOR-CON) 20 MEQ tablet TAKE 1 TABLET BY MOUTH  DAILY  . sitaGLIPtin (JANUVIA) 100 MG tablet Take 50 mg by mouth daily.  Marland Kitchen. losartan-hydrochlorothiazide (HYZAAR) 100-25 MG tablet Take 1 tablet by mouth daily.  . [DISCONTINUED] hydrochlorothiazide (HYDRODIURIL) 25 MG tablet TAKE 1 TABLET BY MOUTH EVERY DAY   No facility-administered encounter medications on file as of 02/20/2020.  :   Review of Systems:  Out of a complete 14 point review of systems, all are reviewed and negative with the exception of these symptoms as listed below:  Review of Systems  Neurological:       Here for evaluation  on memory. Reports decline in memory over the last month. Reports sister was dx with Alzheimer. Was on CPAP but d/c over a year ago. Pt was told to d/c because he did not have OSA.    Objective:  Neurological Exam  Physical Exam Physical Examination:   Vitals:   02/20/20 0828  BP: (!) 156/84  Pulse: 81  SpO2: 96%   General Examination: The patient is a very pleasant 75 y.o. male in no acute distress. He appears well-developed and well-nourished and well groomed.   HEENT: Normocephalic, atraumatic, pupils are equal, round and reactive to light and accommodation.  Face is symmetric with normal facial animation, hearing is grossly intact, speech is clear without dysarthria, airway examination reveals moderate to significantly crowded airway secondary to redundant soft palate and Mallampati class III, tonsils are not visualized, he does recall having had a tonsillectomy as a preschooler.  Neck circumference is 19 7/8 inches.  Tongue protrudes centrally and palate elevates symmetrically, no carotid bruits.    Chest: Clear to auscultation without wheezing, rhonchi or crackles noted.  Heart: S1+S2+0, regular and normal without murmurs, rubs or gallops noted.   Abdomen: Soft, non-tender and non-distended with normal bowel sounds appreciated on auscultation.  Extremities: There is 2+ pitting edema in the distal lower extremities bilaterally, with prominent purplish discoloration in the distal lower extremities and very bright red discoloration of all toes.  Pedal pulses are difficult to palpate.   Skin: Warm and dry with the exception of chronic appearing changes including thickening of the skin in the distal lower extremities.  Dystrophic toenails.      Musculoskeletal: exam reveals no obvious joint deformities, tenderness or joint swelling or erythema.   Neurologically:  Mental status: The patient is awake, alert and oriented to self, situation, season, month, but not to day of the week and  exact date.  He is not able to provide a very detailed history, wife is  able to provide some details.  Speech is clear with normal prosody and enunciation. Thought process is linear. Mood is normal and affect is normal.   On 02/20/2020: MMSE: 18/30, CDT: 1/4, AFT: 11/min.  Cranial nerves II - XII are as described above under HEENT exam. In addition: shoulder shrug is normal with equal shoulder height noted. Motor exam: Normal bulk, strength and tone is noted. There is no drift, resting tremor, and no rebound, no obvious postural or action tremor in both hands.  He does report that his hands seem to be quite steady today.  Romberg is not tested due to safety concerns, reflexes are 1+ in the upper extremities and trace in the lower extremities, toes are equivocal bilaterally.  Fine motor skills are globally mildly impaired, no lateralizing signs, no decrement in amplitude.  He stands with mild difficulty, he stands naturally wider base.  He walks slowly and somewhat cautiously, no shuffling, has preserved arm swing.  No walking aid.   Cerebellar testing shows no dysmetria or intention tremor.  Sensory testing shows intact sensation to light touch, temperature and vibration, he reports intact sensations in the feet as well.  Assessment and Plan:   In summary, Travis [name] is a very pleasant 75 y.o.-year old male with an underlying complex medical history of hypertension, hyperlipidemia, aortic stenosis, sleep apnea, paroxysmal A. fib, diabetes, and obesity, who presents for evaluation of his memory loss of 2 or 3 months duration.  His MMSE is abnormal.  He has a family history of Alzheimer's dementia in his sister.  He also has multiple vascular risk factors and evidence of vascular changes on his recent MRI from 02/11/2020, he appears to be more at risk for vascular dementia in my opinion.  It is very important that his risk factors being managed optimally.  I explained this to them at length today. He has  had episodic confusion which may tie in with hypoglycemic episodes.  He has had several hypoglycemic events, went to the ED even 2 days ago, yesterday had a low of 21 as I understand.  He has had intermittent trembling which may also tie in with blood sugar fluctuation, I explained this to them as well as caffeine intake as he had been utilizing quite a bit of diet soda but lately has reduced it.  He does not have any obvious tremor today, no obvious signs of parkinsonism, I reassured him in that regard.  Memory evaluation would include sleep apnea evaluation as he has a prior diagnosis of this.  It looks to me that his specialists including his cardiologist were under the impression that he has been on CPAP therapy all along.  He reports that he has not been on CPAP for years, could be as long as 40 years that he has been off treatment, per wife, he stopped using his CPAP a few months, less than a year after he got the machine some 40 years ago.  He is strongly encouraged to get reevaluated as untreated obstructive sleep apnea may pose an increased risk for cardiovascular complications and in his particular case may tie in with his heart disease, A. fib diagnosis and his memory issues.  Unfortunately, he is not ready to pursue a sleep study at this time.  He is advised to think about it as treating sleep apnea may be a very important component to his long-term cardiovascular health.  We talked about the importance of healthy lifestyle, good nutrition, weight management and proper hydration  with water.  He is encouraged to limit his caffeine intake to 1-2 servings per day and work on weight loss.  I would not recommend any new medication quite yet until we have a better feel for his blood sugar stability.  He is advised to make a follow-up appointment with you to discuss blood sugar management.  When he was in the emergency room 2 days ago he was advised to cut his Lantus dose in the evening and half which they have  been doing.  I suggested we proceed with an EEG because of the episodic confusion.  We will call them with the test results, I explained this test to them.  I plan to follow-up in the next couple of months, in the interim, if they change their mind about sleep testing, they are welcome to call or email through MyChart.  I answered all their questions today and the patient and his wife are in agreement.   This was an extended visit including review of records including cardiology records and outside ER records.  Thank you very much for allowing me to participate in the care of this nice patient. If I can be of any further assistance to you please do not hesitate to call me at [phone].  Sincerely,   Huston Foley, MD, PhD

## 2020-02-20 NOTE — Patient Instructions (Addendum)
You have complaints of memory loss: memory loss or changes in cognitive function can have many reasons and does not always mean you have dementia. Conditions that can contribute to subjective or objective memory loss include: depression, stress, poor sleep from insomnia or sleep apnea, dehydration, fluctuation in blood sugar values, thyroid or electrolyte dysfunction and certain vitamin deficiencies. Dementia can be caused by stroke, brain atherosclerosis or brain vascular disease due to vascular risk factors (smoking, high blood pressure, high cholesterol, obesity and uncontrolled diabetes), certain degenerative brain disorders (including Parkinson's disease and Multiple sclerosis) and by Alzheimer's disease or other, more rare and sometimes hereditary causes.  As discussed, I recommend you talk to your primary care physician about management of your diabetes as your episodic confusion may tie in with blood sugar fluctuations.  In addition, I strongly recommend that you be evaluated for obstructive sleep apnea with a sleep study.  You were previously diagnosed over 40 years ago with sleep apnea but have not used CPAP in about 40 years by your estimation.  If you have obstructive sleep apnea and if it is in the moderate to severe range, it can affect your memory, your heart function, your risk for irregular heartbeat, congestive heart failure, can be in part the cause for your confusion, disorientation, memory loss, can increase your stroke risk, also your lower extremity swelling can be worsened by untreated sleep apnea.  I believe it is very important that we evaluate you for this and treat you for sleep apnea, I would recommend even treatment of mild sleep apnea in your case given your medical history.   Please think about evaluation with a sleep study, I would be happy to do a laboratory study, if need be we can also consider a home sleep test.  I would not recommend any new medications at this time.  We  will proceed with an EEG (brainwave test), which we will schedule. We will call you with the results.  We will plan a follow-up in about 3 months.  If you change your mind about sleep apnea evaluation, please do call our office or email [place] through MyChart any time.  I would not recommend that you drive at this time.  Please try to work on weight loss.  Please continue to limit your caffeine to 1 or 2 servings per day including coffee, soda, and tea.  Try to hydrate well with water, 6 to 8 cups of water per day are recommended, generally speaking.

## 2020-02-26 ENCOUNTER — Ambulatory Visit: Payer: Medicare Other | Admitting: Neurology

## 2020-02-26 DIAGNOSIS — Z820 Family history of epilepsy and other diseases of the nervous system: Secondary | ICD-10-CM

## 2020-02-26 DIAGNOSIS — R410 Disorientation, unspecified: Secondary | ICD-10-CM

## 2020-02-26 DIAGNOSIS — Z8669 Personal history of other diseases of the nervous system and sense organs: Secondary | ICD-10-CM

## 2020-02-26 DIAGNOSIS — R600 Localized edema: Secondary | ICD-10-CM

## 2020-02-26 DIAGNOSIS — R413 Other amnesia: Secondary | ICD-10-CM

## 2020-02-27 ENCOUNTER — Telehealth: Payer: Self-pay

## 2020-02-27 NOTE — Telephone Encounter (Signed)
-----   Message from Huston Foley, MD sent at 02/27/2020  9:28 AM EST ----- Please call patient or his wife that his EEG, which is the electrical brainwave test, did not show any evidence of seizures or findings that would be concerning for lower seizure threshold.  There were some milder changes in his brain waves, particularly slower waves which can be seen in patients who have memory loss, metabolic changes such as liver function problems, kidney function problems, diabetes issues; overall nonspecific findings and no definitive changes in keeping with seizures or propensity towards seizures.  No further action is required on this test.

## 2020-02-27 NOTE — Telephone Encounter (Signed)
I called the pt and relayed the results. He verbalized understanding. He sts he had a close call while at the mail box and almost fell. He was able to catch himself and return safely to the house. Pt is scheduled to see PCP tomorrow.

## 2020-02-27 NOTE — Progress Notes (Signed)
Please call patient or his wife that his EEG, which is the electrical brainwave test, did not show any evidence of seizures or findings that would be concerning for lower seizure threshold.  There were some milder changes in his brain waves, particularly slower waves which can be seen in patients who have memory loss, metabolic changes such as liver function problems, kidney function problems, diabetes issues; overall nonspecific findings and no definitive changes in keeping with seizures or propensity towards seizures.  No further action is required on this test.

## 2020-02-28 ENCOUNTER — Encounter: Payer: Self-pay | Admitting: Family Medicine

## 2020-02-28 ENCOUNTER — Other Ambulatory Visit: Payer: Self-pay

## 2020-02-28 ENCOUNTER — Ambulatory Visit (INDEPENDENT_AMBULATORY_CARE_PROVIDER_SITE_OTHER): Payer: Medicare Other | Admitting: Family Medicine

## 2020-02-28 DIAGNOSIS — R413 Other amnesia: Secondary | ICD-10-CM

## 2020-02-28 DIAGNOSIS — I10 Essential (primary) hypertension: Secondary | ICD-10-CM | POA: Diagnosis not present

## 2020-02-28 DIAGNOSIS — E11649 Type 2 diabetes mellitus with hypoglycemia without coma: Secondary | ICD-10-CM

## 2020-02-28 MED ORDER — DEXCOM G6 SENSOR MISC: 3 refills | Status: DC

## 2020-02-28 MED ORDER — HYDROCHLOROTHIAZIDE 25 MG PO TABS: 25.0000 mg | ORAL_TABLET | Freq: Every day | ORAL | 1 refills | Status: DC

## 2020-02-28 MED ORDER — FLUOXETINE HCL 20 MG PO CAPS: 20.0000 mg | ORAL_CAPSULE | Freq: Every day | ORAL | 1 refills | Status: DC

## 2020-02-28 MED ORDER — DEXCOM G6 TRANSMITTER MISC: 4 refills | Status: DC

## 2020-02-28 MED ORDER — DEXCOM G6 RECEIVER DEVI: 0 refills | Status: DC

## 2020-02-28 NOTE — Patient Instructions (Signed)
Great to see you today! Let's restart HCTZ at 25mg  for blood pressure.  Start fluoxetine (prozac) 20mg  daily. See me again in about 4 weeks.

## 2020-03-02 NOTE — Assessment & Plan Note (Signed)
Continued memory difficulties.  May benefit from going ahead and starting donepezil.  I also think depression may be contributing and/or worsening his symptoms.  Starting fluoxetine 20mg  daily.

## 2020-03-02 NOTE — Progress Notes (Signed)
Travis [name] - 75 y.o. male MRN [id number]  Date of birth: 12/13/1945  Subjective Chief Complaint  Patient presents with  . Memory Loss  . Headache    HPI Travis [name] is a 75 y.o. male here today for follow up of memory problems.  He also has a history of HTN, T2DM, and HLD.    I saw him about 3 weeks ago for initial visit for memory loss.  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  Lab evaluation was unremarkable.  He has had some problems with hypoglycemia.  We had discussed stopping amaryl if he had additional episodes.  He did have a couple of recurrent episodes and was seen in the ED.  Lantus reduced from 90 units to 45 units.  Blood sugars have been in the 100-150 range since making this change.  Despite this his wife notes that he continues to have issues with memory and confusion. He was seen by neurology and had EEG completed which was relatively unremarkable.  Recommended that he have sleep study as well.  He has had tremor off and on, not noted at neuro appt.  Discussed possible symptoms of depression and he admits to feeling depressed and anxious at times.  He would be open to trying medication to help with this.   BP elevated today.  He has not currently taking HCTZ.  He had itching with amlodipine.  He has had headache. Denies chest pain, shortness of breath, palpitations, or vision changes.    ROS:  A comprehensive ROS was completed and negative except as noted per HPI     Allergies  Allergen Reactions  . Norvasc [Amlodipine Besylate] Itching  . Penicillins Itching  . Prinivil [Lisinopril] Itching    Past Medical History:  Diagnosis Date  . Aortic stenosis    AVR 01/2009 with a Magna Ease #23 pericardial tissue valve; echo 07/22/11-EF 50-55%, aortic valve is well seated  . Chest pain    myoview 11-14-2008- normal study  . Diabetes mellitus type 2 in obese (HCC)   . HTN  (hypertension)   . Hyperlipemia   . Obesity   . OSA (obstructive sleep apnea)   . PAF (paroxysmal atrial fibrillation) (HCC)    on amiodarone briefly  . Sleep apnea     Past Surgical History:  Procedure Laterality Date  . AORTIC VALVE REPLACEMENT  01/15/2009   AVR with a 13mm Magna Ease valve [id number], model 3300TFX  . CARDIAC CATHETERIZATION  12/12/08   normal coronaries, nl EF, severe AS  . 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   fibrous malunion of sternotomy     Social History   Socioeconomic History  . Marital status: Married    Spouse name: Not on file  . Number of children: Not on file  . Years of education: Not on file  . Highest education level: Not on file  Occupational History  . Not on file  Tobacco Use  . Smoking status: Former Smoker    Types: Cigarettes    Quit date: 02/02/1988    Years since quitting: 32.1  . Smokeless tobacco: Never Used  Substance and Sexual Activity  . Alcohol use: No  . Drug use: No  . Sexual activity: Not on file  Other Topics Concern  . Not on file  Social History Narrative  . Not on file   Social Determinants of Health   Financial Resource Strain: Not on file  Food Insecurity: Not on file  Transportation Needs: Not on file  Physical Activity: Not on file  Stress: Not on file  Social Connections: Not on file    Family History  Problem Relation Age of Onset  . Cancer Mother   . Diabetes Brother     Health Maintenance  Topic Date Due  . Hepatitis C Screening  Never done  . FOOT EXAM  Never done  . OPHTHALMOLOGY EXAM  Never done  . HEMOGLOBIN A1C  07/04/2020  . TETANUS/TDAP  10/14/2025  . INFLUENZA VACCINE  Completed  . COVID-19 Vaccine  Completed  . PNA vac Low Risk Adult  Completed     ----------------------------------------------------------------------------------------------------------------------------------------------------------------------------------------------------------------- Physical Exam BP (!)  185/93 (BP Location: Left Arm, Patient Position: Sitting, Cuff Size: Large)   Pulse 73   Wt 260 lb 1.9 oz (118 kg)   SpO2 97%   BMI 39.55 kg/m   Physical Exam Constitutional:      Appearance: Normal appearance. He is well-developed.  Eyes:     General: No scleral icterus.    Pupils: Pupils are equal, round, and reactive to light.  Cardiovascular:     Rate and Rhythm: Normal rate and regular rhythm.  Pulmonary:     Effort: Pulmonary effort is normal.     Breath sounds: Normal breath sounds.  Musculoskeletal:     Cervical back: Neck supple.  Neurological:     General: No focal deficit present.     Mental Status: He is alert and oriented to person, place, and time.  Psychiatric:        Mood and Affect: Mood normal.        Behavior: Behavior normal.     ------------------------------------------------------------------------------------------------------------------------------------------------------------------------------------------------------------------- Assessment and Plan  Essential hypertension BP elevated.  Restart HCTZ and continue metoprolol.  Recommend low sodium diet.  Update BMP at follow up visit.    Hypoglycemia associated with type 2 diabetes mellitus (HCC) He has had recurrent episodes of hypoglycemia.  This has improved with decreasing lantus.  Continue at 45 units.  Will also add dexcom CGM for closer monitoring of blood glucose.    Memory loss Continued memory difficulties.  May benefit from going ahead and starting donepezil.  I also think depression may be contributing and/or worsening his symptoms.  Starting fluoxetine 20mg  daily.     Meds ordered this encounter  Medications  . hydrochlorothiazide (HYDRODIURIL) 25 MG tablet    Sig: Take 1 tablet (25 mg total) by mouth daily.    Dispense:  90 tablet    Refill:  1  . FLUoxetine (PROZAC) 20 MG capsule    Sig: Take 1 capsule (20 mg total) by mouth daily.    Dispense:  90 capsule    Refill:  1  .  Continuous Blood Gluc Receiver (DEXCOM G6 RECEIVER) DEVI    Sig: Use to check glucose daily as needed.  Diagnosis: E11.649    Dispense:  1 each    Refill:  0  . Continuous Blood Gluc Sensor (DEXCOM G6 SENSOR) MISC    Sig: Replace every 10 days.  Use to monitor glucose Diagnosis: E11.649    Dispense:  12 each    Refill:  3  . Continuous Blood Gluc Transmit (DEXCOM G6 TRANSMITTER) MISC    Sig: Replace q3 months. Use to monitor glucose.  Diagnosis: E11.649    Dispense:  1 each    Refill:  4    Return in about 4 weeks (around 03/27/2020) for f/u HTN/Memory.  This visit occurred during the SARS-CoV-2 public health emergency.  Safety protocols were in place, including screening questions prior to the visit, additional usage of staff PPE, and extensive cleaning of exam room while observing appropriate contact time as indicated for disinfecting solutions.

## 2020-03-02 NOTE — Assessment & Plan Note (Addendum)
He has had recurrent episodes of hypoglycemia.  This has improved with decreasing lantus.  Continue at 45 units.  Will also add dexcom CGM for closer monitoring of blood glucose.

## 2020-03-02 NOTE — Assessment & Plan Note (Signed)
BP elevated.  Restart HCTZ and continue metoprolol.  Recommend low sodium diet.  Update BMP at follow up visit.

## 2020-03-11 ENCOUNTER — Other Ambulatory Visit: Payer: Self-pay | Admitting: Cardiovascular Disease

## 2020-03-27 ENCOUNTER — Ambulatory Visit: Payer: Medicare Other | Admitting: Family Medicine

## 2020-03-30 ENCOUNTER — Other Ambulatory Visit: Payer: Self-pay | Admitting: Cardiovascular Disease

## 2020-03-31 ENCOUNTER — Encounter: Payer: Self-pay | Admitting: Family Medicine

## 2020-03-31 ENCOUNTER — Other Ambulatory Visit: Payer: Self-pay

## 2020-03-31 ENCOUNTER — Ambulatory Visit (INDEPENDENT_AMBULATORY_CARE_PROVIDER_SITE_OTHER): Payer: Medicare Other | Admitting: Family Medicine

## 2020-03-31 DIAGNOSIS — E1169 Type 2 diabetes mellitus with other specified complication: Secondary | ICD-10-CM | POA: Diagnosis not present

## 2020-03-31 DIAGNOSIS — R413 Other amnesia: Secondary | ICD-10-CM

## 2020-03-31 DIAGNOSIS — I10 Essential (primary) hypertension: Secondary | ICD-10-CM | POA: Diagnosis not present

## 2020-03-31 DIAGNOSIS — E669 Obesity, unspecified: Secondary | ICD-10-CM

## 2020-03-31 MED ORDER — DONEPEZIL HCL 5 MG PO TABS: 5.0000 mg | ORAL_TABLET | Freq: Every day | ORAL | 1 refills | Status: DC

## 2020-03-31 MED ORDER — FLUOXETINE HCL 40 MG PO CAPS: 40.0000 mg | ORAL_CAPSULE | Freq: Every day | ORAL | 1 refills | Status: DC

## 2020-03-31 NOTE — Assessment & Plan Note (Signed)
BP elevated today.  Can consider change from metoprolol to carvedilol if BP remains elevated.  Medications currently managed by cardiology.

## 2020-03-31 NOTE — Patient Instructions (Signed)
Increase fluoxetine to 40mg  daily Start aricept 5mg  daily See me again in about 6 weeks

## 2020-03-31 NOTE — Assessment & Plan Note (Signed)
Still awaiting dexcom.  Continue insulin at current dosing.

## 2020-03-31 NOTE — Progress Notes (Signed)
Travis [name] - 75 y.o. male MRN [id number]  Date of birth: 26-Jun-1945  Subjective Chief Complaint  Patient presents with  . Memory Loss    HPI Travis [name] is a 75 y.o. male here today for follow up of memory loss.  He continues to have progressive memory loss.  He does not recall seeing me or any of our staff at his last couple of visits.  He seemed to be having significant depressive symptoms at his last visit as well and I started him on fluoxetine.  His wife reports that his mood seems to have improved since starting this.  Would like to try increase in this as well.     Concern that hypoglycemia was contributing as well, currently only taking insulin and he has not had any further episodes of hypoglycemia.  Blood sugars in the evening have been elevated into the low to mid 200's.  He has been able to get dexcom yet.  ROS:  A comprehensive ROS was completed and negative except as noted per HPI  Allergies  Allergen Reactions  . Norvasc [Amlodipine Besylate] Itching  . Penicillins Itching  . Prinivil [Lisinopril] Itching    Past Medical History:  Diagnosis Date  . Aortic stenosis    AVR 01/2009 with a Magna Ease #23 pericardial tissue valve; echo 07/22/11-EF 50-55%, aortic valve is well seated  . Chest pain    myoview 11-14-2008- normal study  . Diabetes mellitus type 2 in obese (HCC)   . HTN (hypertension)   . Hyperlipemia   . Obesity   . OSA (obstructive sleep apnea)   . PAF (paroxysmal atrial fibrillation) (HCC)    on amiodarone briefly  . Sleep apnea     Past Surgical History:  Procedure Laterality Date  . AORTIC VALVE REPLACEMENT  01/15/2009   AVR with a 85mm Magna Ease valve [id number], model 3300TFX  . CARDIAC CATHETERIZATION  12/12/08   normal coronaries, nl EF, severe AS  . 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   fibrous malunion of sternotomy     Social History   Socioeconomic History  . Marital status: Married    Spouse name: Not on file  . Number of children:  Not on file  . Years of education: Not on file  . Highest education level: Not on file  Occupational History  . Not on file  Tobacco Use  . Smoking status: Former Smoker    Types: Cigarettes    Quit date: 02/02/1988    Years since quitting: 32.1  . Smokeless tobacco: Never Used  Substance and Sexual Activity  . Alcohol use: No  . Drug use: No  . Sexual activity: Not on file  Other Topics Concern  . Not on file  Social History Narrative  . Not on file   Social Determinants of Health   Financial Resource Strain: Not on file  Food Insecurity: Not on file  Transportation Needs: Not on file  Physical Activity: Not on file  Stress: Not on file  Social Connections: Not on file    Family History  Problem Relation Age of Onset  . Cancer Mother   . Diabetes Brother     Health Maintenance  Topic Date Due  . Hepatitis C Screening  Never done  . FOOT EXAM  Never done  . OPHTHALMOLOGY EXAM  Never done  . HEMOGLOBIN A1C  07/04/2020  . TETANUS/TDAP  10/14/2025  . INFLUENZA VACCINE  Completed  . COVID-19 Vaccine  Completed  . PNA vac  Low Risk Adult  Completed  . HPV VACCINES  Aged Out     ----------------------------------------------------------------------------------------------------------------------------------------------------------------------------------------------------------------- Physical Exam BP (!) 151/69 (BP Location: Left Arm, Patient Position: Sitting, Cuff Size: Large)   Pulse (!) 53   Wt 258 lb (117 kg)   SpO2 97%   BMI 39.23 kg/m   Physical Exam Constitutional:      Appearance: Normal appearance.  HENT:     Head: Normocephalic and atraumatic.  Eyes:     General: No scleral icterus. Cardiovascular:     Rate and Rhythm: Normal rate and regular rhythm.  Pulmonary:     Effort: Pulmonary effort is normal.     Breath sounds: Normal breath sounds.  Musculoskeletal:     Cervical back: Neck supple.  Neurological:     General: No focal deficit  present.     Mental Status: He is alert.     Cranial Nerves: No cranial nerve deficit.     Comments: Oriented to person only.   Psychiatric:        Mood and Affect: Mood normal.        Behavior: Behavior normal.     ------------------------------------------------------------------------------------------------------------------------------------------------------------------------------------------------------------------- Assessment and Plan  Memory loss Continues to have progressive memory loss.  Doesn't recall seeing me from previous appointments.  Discussed trying addition of aricept.  Will continue titration of fluoxetine with increase to 40mg  daily.  Has follow up with neurology in April.  Return in about 6 weeks (around 05/12/2020) for HTN/T2DM/Memory.   Diabetes mellitus type 2 in obese Still awaiting dexcom.  Continue insulin at current dosing.   Essential hypertension BP elevated today.  Can consider change from metoprolol to carvedilol if BP remains elevated.  Medications currently managed by cardiology.     Meds ordered this encounter  Medications  . FLUoxetine (PROZAC) 40 MG capsule    Sig: Take 1 capsule (40 mg total) by mouth daily.    Dispense:  90 capsule    Refill:  1  . donepezil (ARICEPT) 5 MG tablet    Sig: Take 1 tablet (5 mg total) by mouth at bedtime.    Dispense:  90 tablet    Refill:  1    Return in about 6 weeks (around 05/12/2020) for HTN/T2DM/Memory.    This visit occurred during the SARS-CoV-2 public health emergency.  Safety protocols were in place, including screening questions prior to the visit, additional usage of staff PPE, and extensive cleaning of exam room while observing appropriate contact time as indicated for disinfecting solutions.

## 2020-03-31 NOTE — Assessment & Plan Note (Signed)
Continues to have progressive memory loss.  Doesn't recall seeing me from previous appointments.  Discussed trying addition of aricept.  Will continue titration of fluoxetine with increase to 40mg  daily.  Has follow up with neurology in April.  Return in about 6 weeks (around 05/12/2020) for HTN/T2DM/Memory.

## 2020-04-01 ENCOUNTER — Telehealth: Payer: Self-pay

## 2020-04-01 NOTE — Telephone Encounter (Signed)
The Dexcom kit has been ordered via [employer] [phone]).    His account has been setup. PCP office should receive verification documents and requests for information within 7 days.   Shipping takes 5 - 7 business days once verification has been completed.

## 2020-04-03 ENCOUNTER — Other Ambulatory Visit: Payer: Self-pay

## 2020-04-03 DIAGNOSIS — E669 Obesity, unspecified: Secondary | ICD-10-CM

## 2020-04-03 DIAGNOSIS — E1169 Type 2 diabetes mellitus with other specified complication: Secondary | ICD-10-CM

## 2020-04-03 MED ORDER — DEXCOM G6 TRANSMITTER MISC: 4 refills | Status: AC

## 2020-04-03 MED ORDER — DEXCOM G6 RECEIVER DEVI: 0 refills | Status: AC

## 2020-04-03 MED ORDER — DEXCOM G6 SENSOR MISC: 3 refills | Status: AC

## 2020-04-09 ENCOUNTER — Other Ambulatory Visit: Payer: Self-pay

## 2020-04-09 ENCOUNTER — Ambulatory Visit (HOSPITAL_COMMUNITY): Payer: Medicare Other | Attending: Cardiology

## 2020-04-09 DIAGNOSIS — Z952 Presence of prosthetic heart valve: Secondary | ICD-10-CM | POA: Insufficient documentation

## 2020-04-09 LAB — ECHOCARDIOGRAM COMPLETE
AR max vel: 1.51 cm2
AV Area VTI: 1.64 cm2
AV Area mean vel: 1.54 cm2
AV Mean grad: 24 mmHg
AV Peak grad: 38.3 mmHg
Ao pk vel: 3.1 m/s
Area-P 1/2: 3.77 cm2
P 1/2 time: 656 msec
S' Lateral: 3.1 cm

## 2020-04-09 MED ORDER — PERFLUTREN LIPID MICROSPHERE
1.0000 mL | INTRAVENOUS | Status: AC | PRN
  Administered 2020-04-09: 2 mL via INTRAVENOUS

## 2020-04-25 ENCOUNTER — Other Ambulatory Visit: Payer: Self-pay | Admitting: Cardiovascular Disease

## 2020-05-02 ENCOUNTER — Encounter: Payer: Self-pay | Admitting: Cardiovascular Disease

## 2020-05-02 ENCOUNTER — Ambulatory Visit: Payer: Medicare Other | Admitting: Cardiovascular Disease

## 2020-05-02 ENCOUNTER — Other Ambulatory Visit: Payer: Self-pay

## 2020-05-02 VITALS — BP 138/76 | HR 65 | Ht 68.0 in | Wt 254.0 lb

## 2020-05-02 DIAGNOSIS — I10 Essential (primary) hypertension: Secondary | ICD-10-CM | POA: Diagnosis not present

## 2020-05-02 DIAGNOSIS — E782 Mixed hyperlipidemia: Secondary | ICD-10-CM

## 2020-05-02 DIAGNOSIS — Z952 Presence of prosthetic heart valve: Secondary | ICD-10-CM

## 2020-05-02 DIAGNOSIS — R0989 Other specified symptoms and signs involving the circulatory and respiratory systems: Secondary | ICD-10-CM | POA: Diagnosis not present

## 2020-05-02 NOTE — Progress Notes (Signed)
05/02/2020 Travis NUDELMAN   April 07, 1945  [id number]  Primary Physician Everrett Coombe, DO Primary Cardiologist: Runell Gess MD FACP, [place], [place], [place]  HPI:  Travis [name] is a 75 y.o.  moderately overweight married Caucasian male with no children who I last sawin the office 03/28/2019. He is status post aortic valve replacement by Dr. Kathlee Nations Trigt with a Magna Ease #23 pericardial tissue valve for severe aortic stenosis12/15/10He had normal coronary arteries at that time with normal LV function.  His other problems include hypertension, hyperlipidemia, and non-insulin requiring diabetes. He has some perioperative atrial fibrillation on amiodarone briefly. He had sternal re-wiring in March of 2011 because of dehiscence but has done well since. He does have obstructive sleep apnea on CPAP.   Since I saw him last a year ago he has remained stable. He denies chest pain or shortness of breath. He has developed some dementia since I saw him last as well as some instability on his feet now requiring a cane to walk.  He is seeing a neurologist.  He has had some lower extremity edema improved with hydrochlorothiazide.  Recent 2D echocardiogram performed 04/09/2020 revealed at least moderate AI of his prosthetic valve worse compared to his previous echo with a peak aortic gradient of 40 mmHg.  LV function was normal.   Current Meds  Medication Sig  . Acetaminophen (TYLENOL PO) Take by mouth.  Travis [name] aspirin 81 MG tablet Take 81 mg by mouth daily.  . Continuous Blood Gluc Receiver (DEXCOM G6 RECEIVER) DEVI Use to check glucose daily as needed.  Diagnosis: E11.649  . Continuous Blood Gluc Sensor (DEXCOM G6 SENSOR) MISC Replace every 10 days.  Use to monitor glucose Diagnosis: E11.649  . Continuous Blood Gluc Transmit (DEXCOM G6 TRANSMITTER) MISC Replace q3 months. Use to monitor glucose.  Diagnosis: E11.649  . donepezil (ARICEPT) 5 MG tablet Take 1 tablet (5 mg total) by mouth at bedtime.  Travis [name]  ezetimibe-simvastatin (VYTORIN) 10-20 MG tablet TAKE 1 TABLET BY MOUTH AT  BEDTIME  . FLUoxetine (PROZAC) 40 MG capsule Take 1 capsule (40 mg total) by mouth daily.  . furosemide (LASIX) 40 MG tablet TAKE 1 TABLET BY MOUTH  TWICE DAILY  . glucose blood (ACCU-CHEK AVIVA PLUS) test strip   . hydrochlorothiazide (HYDRODIURIL) 25 MG tablet Take 1 tablet (25 mg total) by mouth daily.  . insulin glargine (LANTUS) 100 UNIT/ML injection Inject 45 Units into the skin daily.  . Insulin Pen Needle (B-D ULTRAFINE III SHORT PEN) 31G X 8 MM MISC USE TO INJECT ONCE DAILY AS DIRECTED  . losartan (COZAAR) 100 MG tablet TAKE 1 TABLET BY MOUTH  DAILY  . metoprolol succinate (TOPROL-XL) 100 MG 24 hr tablet TAKE 1 TABLET BY MOUTH  DAILY WITH OR IMMEDIATELY  FOLLOWING A MEAL  . potassium chloride SA (KLOR-CON) 20 MEQ tablet TAKE 1 TABLET BY MOUTH  DAILY     Allergies  Allergen Reactions  . Norvasc [Amlodipine Besylate] Itching  . Penicillins Itching  . Prinivil [Lisinopril] Itching    Social History   Socioeconomic History  . Marital status: Married    Spouse name: Not on file  . Number of children: Not on file  . Years of education: Not on file  . Highest education level: Not on file  Occupational History  . Not on file  Tobacco Use  . Smoking status: Former Smoker    Types: Cigarettes    Quit date: 02/02/1988    Years  since quitting: 32.2  . Smokeless tobacco: Never Used  Substance and Sexual Activity  . Alcohol use: No  . Drug use: No  . Sexual activity: Not on file  Other Topics Concern  . Not on file  Social History Narrative  . Not on file   Social Determinants of Health   Financial Resource Strain: Not on file  Food Insecurity: Not on file  Transportation Needs: Not on file  Physical Activity: Not on file  Stress: Not on file  Social Connections: Not on file  Intimate Partner Violence: Not on file     Review of Systems: General: negative for chills, fever, night sweats or  weight changes.  Cardiovascular: negative for chest pain, dyspnea on exertion, edema, orthopnea, palpitations, paroxysmal nocturnal dyspnea or shortness of breath Dermatological: negative for rash Respiratory: negative for cough or wheezing Urologic: negative for hematuria Abdominal: negative for nausea, vomiting, diarrhea, bright red blood per rectum, melena, or hematemesis Neurologic: negative for visual changes, syncope, or dizziness All other systems reviewed and are otherwise negative except as noted above.    Blood pressure 138/76, pulse 65, height 5\' 8"  (1.727 m), weight 254 lb (115.2 kg), SpO2 95 %.  General appearance: alert and no distress Neck: no adenopathy, no JVD, supple, symmetrical, trachea midline, thyroid not enlarged, symmetric, no tenderness/mass/nodules and Bilateral carotid bruits Lungs: clear to auscultation bilaterally Heart: regular rate and rhythm, S1, S2 normal, no murmur, click, rub or gallop Extremities: 1-2+ pitting lower extremity with venous stasis changes. Pulses: 2+ and symmetric Skin: Skin color, texture, turgor normal. No rashes or lesions Neurologic: Alert and oriented X 3, normal strength and tone. Normal symmetric reflexes. Normal coordination and gait  EKG sinus rhythm at 65 without ST or T wave changes.  Personally reviewed this EKG.  ASSESSMENT AND PLAN:   Status post aortic valve replacement, November 2010, magna ease #23 pericardial tissue valve History of aortic stenosis status post aortic valve replacement by Dr. 08-17-1971 01/15/2009 with a magna ease 23 mm pericardial tissue valve.  He did have a 2D echo performed 04/09/2020 that showed normal LV systolic function with at least moderate AI and mild valvular aortic stenosis.  He does have some lower extremity edema.  We will recheck a 2D echo in 1 year.  Essential hypertension Blood work from his PCP history of essential hypertension blood pressure measured today 138/76.  He is on  hydrochlorothiazide, metoprolol and losartan.  Hyperlipidemia History of hyperlipidemia on Vytorin followed by his P CP.      06/09/2020 MD FACP,FACC,FAHA, [facility] 05/02/2020 11:51 AM

## 2020-05-02 NOTE — Assessment & Plan Note (Signed)
History of hyperlipidemia on Vytorin followed by his PCP 

## 2020-05-02 NOTE — Patient Instructions (Signed)
Medication Instructions:  Your physician recommends that you continue on your current medications as directed. Please refer to the Current Medication list given to you today.  *If you need a refill on your cardiac medications before your next appointment, please call your pharmacy*   Testing/Procedures: Your physician has requested that you have a carotid duplex. This test is an ultrasound of the carotid arteries in your neck. It looks at blood flow through these arteries that supply the brain with blood. Allow one hour for this exam. There are no restrictions or special instructions. This procedure is done at [address] [facility]. 2nd Floor  Your physician has requested that you have an echocardiogram. Echocardiography is a painless test that uses sound waves to create images of your heart. It provides your doctor with information about the size and shape of your heart and how well your heart's chambers and valves are working. This procedure takes approximately one hour. There are no restrictions for this procedure. This procedure is done at [address] 3rd [address]    Follow-Up: At [facility], you and your health needs are our priority.  As part of our continuing mission to provide you with exceptional heart care, we have created designated Provider Care Teams.  These Care Teams include your primary Cardiologist (physician) and Advanced Practice Providers (APPs -  Physician Assistants and Nurse Practitioners) who all work together to provide you with the care you need, when you need it.  We recommend signing up for the patient portal called "MyChart".  Sign up information is provided on this After Visit Summary.  MyChart is used to connect with patients for Virtual Visits (Telemedicine).  Patients are able to view lab/test results, encounter notes, upcoming appointments, etc.  Non-urgent messages can be sent to your provider as well.   To learn more about what you can do with MyChart, go to  [email or web address].    Your next appointment:   6 month(s)  The format for your next appointment:   In Person  Provider:   You will see one of the following Advanced Practice Providers on your designated Care Team:    Marjie Skiff, PA-C  Edd Fabian, FNP  Then, Nanetta Batty, MD will plan to see you again in 12 month(s).

## 2020-05-02 NOTE — Assessment & Plan Note (Signed)
Blood work from his PCP history of essential hypertension blood pressure measured today 138/76.  He is on hydrochlorothiazide, metoprolol and losartan.

## 2020-05-02 NOTE — Assessment & Plan Note (Signed)
History of aortic stenosis status post aortic valve replacement by Dr. Maren Beach 01/15/2009 with a magna ease 23 mm pericardial tissue valve.  He did have a 2D echo performed 04/09/2020 that showed normal LV systolic function with at least moderate AI and mild valvular aortic stenosis.  He does have some lower extremity edema.  We will recheck a 2D echo in 1 year.

## 2020-05-05 NOTE — Addendum Note (Signed)
Addended by: Dorris Fetch on: 05/05/2020 03:02 PM   Modules accepted: Orders

## 2020-05-12 ENCOUNTER — Other Ambulatory Visit: Payer: Self-pay

## 2020-05-12 ENCOUNTER — Ambulatory Visit (INDEPENDENT_AMBULATORY_CARE_PROVIDER_SITE_OTHER): Payer: Medicare Other | Admitting: Family Medicine

## 2020-05-12 ENCOUNTER — Encounter: Payer: Self-pay | Admitting: Family Medicine

## 2020-05-12 VITALS — BP 143/54 | HR 52 | Ht 68.0 in | Wt 255.0 lb

## 2020-05-12 DIAGNOSIS — I10 Essential (primary) hypertension: Secondary | ICD-10-CM

## 2020-05-12 DIAGNOSIS — E669 Obesity, unspecified: Secondary | ICD-10-CM | POA: Diagnosis not present

## 2020-05-12 DIAGNOSIS — R197 Diarrhea, unspecified: Secondary | ICD-10-CM | POA: Diagnosis not present

## 2020-05-12 DIAGNOSIS — E1169 Type 2 diabetes mellitus with other specified complication: Secondary | ICD-10-CM

## 2020-05-12 DIAGNOSIS — R413 Other amnesia: Secondary | ICD-10-CM | POA: Diagnosis not present

## 2020-05-12 LAB — POCT GLYCOSYLATED HEMOGLOBIN (HGB A1C): Hemoglobin A1C: 8.6 % — AB (ref 4.0–5.6)

## 2020-05-12 NOTE — Assessment & Plan Note (Signed)
BP is fairly well controlled at this time.  Recommend continuation of current medications.

## 2020-05-12 NOTE — Assessment & Plan Note (Signed)
A1c is up some today.  Recommend low carbohydrate diet.  Will increase lantus to 50 units daily.

## 2020-05-12 NOTE — Patient Instructions (Addendum)
Have labs completed Increase insulin to 50 units daily.  See me again in 3 months.

## 2020-05-12 NOTE — Progress Notes (Signed)
Travis [name] - 75 y.o. male MRN [id number]  Date of birth: 01/22/1946  Subjective Chief Complaint  Patient presents with  . Hypertension  . Diabetes    HPI Travis [name] is 75 y.o. male here today for follow up visit.  He has medical history listed below.    His diabetes is being managed with lantus 45 units daily.  Several of his medications were reduced or discontinued due to hypoglycemia.   He is using dexcom to monitor glucose with readings around 180 on average.  He has not had any further episodes of hypoglycemia.    HTN is managed with combination of losartan, metoprolol and furosemide.  It has been fairly well controlled with this.  He is also followed by cardiology.  He had recent Echo and has upcoming carotid US.   Memory issues have stabilized since addition of donepezil.  He has noted some diarrhea over the past few weeks.  Denies abdominal pain or appetite change. Fluoxetine seems to have helped as well with depressive symptoms.  He does have follow up with neurology in a couple of weeks.   ROS:  A comprehensive ROS was completed and negative except as noted per HPI  Allergies  Allergen Reactions  . Norvasc [Amlodipine Besylate] Itching  . Penicillins Itching  . Prinivil [Lisinopril] Itching    Past Medical History:  Diagnosis Date  . Aortic stenosis    AVR 01/2009 with a Magna Ease #23 pericardial tissue valve; echo 07/22/11-EF 50-55%, aortic valve is well seated  . Chest pain    myoview 11-14-2008- normal study  . Diabetes mellitus type 2 in obese (HCC)   . HTN (hypertension)   . Hyperlipemia   . Obesity   . OSA (obstructive sleep apnea)   . PAF (paroxysmal atrial fibrillation) (HCC)    on amiodarone briefly  . Sleep apnea     Past Surgical History:  Procedure Laterality Date  . AORTIC VALVE REPLACEMENT  01/15/2009   AVR with a 66mm Magna Ease valve [id number], model 3300TFX  . CARDIAC CATHETERIZATION  12/12/08   normal coronaries, nl EF, severe AS  .  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   fibrous malunion of sternotomy     Social History   Socioeconomic History  . Marital status: Married    Spouse name: Not on file  . Number of children: Not on file  . Years of education: Not on file  . Highest education level: Not on file  Occupational History  . Not on file  Tobacco Use  . Smoking status: Former Smoker    Types: Cigarettes    Quit date: 02/02/1988    Years since quitting: 32.2  . Smokeless tobacco: Never Used  Substance and Sexual Activity  . Alcohol use: No  . Drug use: No  . Sexual activity: Not on file  Other Topics Concern  . Not on file  Social History Narrative  . Not on file   Social Determinants of Health   Financial Resource Strain: Not on file  Food Insecurity: Not on file  Transportation Needs: Not on file  Physical Activity: Not on file  Stress: Not on file  Social Connections: Not on file    Family History  Problem Relation Age of Onset  . Cancer Mother   . Diabetes Brother     Health Maintenance  Topic Date Due  . Hepatitis C Screening  Never done  . FOOT EXAM  Never done  . OPHTHALMOLOGY EXAM  Never done  .  INFLUENZA VACCINE  09/01/2020  . HEMOGLOBIN A1C  11/11/2020  . TETANUS/TDAP  10/14/2025  . COVID-19 Vaccine  Completed  . PNA vac Low Risk Adult  Completed  . HPV VACCINES  Aged Out     ----------------------------------------------------------------------------------------------------------------------------------------------------------------------------------------------------------------- Physical Exam BP (!) 143/54 (BP Location: Left Arm, Patient Position: Sitting, Cuff Size: Large)   Pulse (!) 52   Ht 5\' 8"  (1.727 m)   Wt 255 lb (115.7 kg)   SpO2 97%   BMI 38.77 kg/m   Physical Exam Constitutional:      Appearance: Normal appearance.  Eyes:     General: No scleral icterus. Cardiovascular:     Rate and Rhythm: Normal rate and regular rhythm.  Pulmonary:     Effort:  Pulmonary effort is normal.     Breath sounds: Normal breath sounds.  Musculoskeletal:     Cervical back: Neck supple.  Neurological:     General: No focal deficit present.     Mental Status: He is alert.  Psychiatric:        Mood and Affect: Mood normal.        Behavior: Behavior normal.     ------------------------------------------------------------------------------------------------------------------------------------------------------------------------------------------------------------------- Assessment and Plan  Diabetes mellitus type 2 in obese A1c is up some today.  Recommend low carbohydrate diet.  Will increase lantus to 50 units daily.    Essential hypertension BP is fairly well controlled at this time.  Recommend continuation of current medications.    Memory loss Stabilized with management of depression and addition of donepezil.  He has developed some diarrhea.  Unsure if this is related to donepezil.  Will check c. Diff, however if this persists we may have to consider d/c of donepezil.    No orders of the defined types were placed in this encounter.   Return in about 3 months (around 08/11/2020) for HTN/DM.    This visit occurred during the SARS-CoV-2 public health emergency.  Safety protocols were in place, including screening questions prior to the visit, additional usage of staff PPE, and extensive cleaning of exam room while observing appropriate contact time as indicated for disinfecting solutions.

## 2020-05-12 NOTE — Assessment & Plan Note (Signed)
Stabilized with management of depression and addition of donepezil.  He has developed some diarrhea.  Unsure if this is related to donepezil.  Will check c. Diff, however if this persists we may have to consider d/c of donepezil.

## 2020-05-13 ENCOUNTER — Encounter (HOSPITAL_COMMUNITY): Payer: Medicare Other

## 2020-05-13 LAB — CBC WITH DIFFERENTIAL/PLATELET
Absolute Monocytes: 490 cells/uL (ref 200–950)
Basophils Absolute: 53 cells/uL (ref 0–200)
Basophils Relative: 0.6 %
Eosinophils Absolute: 223 cells/uL (ref 15–500)
Eosinophils Relative: 2.5 %
HCT: 40.5 % (ref 38.5–50.0)
Hemoglobin: 13.6 g/dL (ref 13.2–17.1)
Lymphs Abs: 2216 cells/uL (ref 850–3900)
MCH: 30.4 pg (ref 27.0–33.0)
MCHC: 33.6 g/dL (ref 32.0–36.0)
MCV: 90.6 fL (ref 80.0–100.0)
MPV: 10.6 fL (ref 7.5–12.5)
Monocytes Relative: 5.5 %
Neutro Abs: 5919 cells/uL (ref 1500–7800)
Neutrophils Relative %: 66.5 %
Platelets: 179 10*3/uL (ref 140–400)
RBC: 4.47 10*6/uL (ref 4.20–5.80)
RDW: 12.9 % (ref 11.0–15.0)
Total Lymphocyte: 24.9 %
WBC: 8.9 10*3/uL (ref 3.8–10.8)

## 2020-05-13 LAB — BASIC METABOLIC PANEL
BUN/Creatinine Ratio: 15 (calc) (ref 6–22)
BUN: 20 mg/dL (ref 7–25)
CO2: 28 mmol/L (ref 20–32)
Calcium: 9.1 mg/dL (ref 8.6–10.3)
Chloride: 99 mmol/L (ref 98–110)
Creat: 1.35 mg/dL — ABNORMAL HIGH (ref 0.70–1.18)
Glucose, Bld: 282 mg/dL — ABNORMAL HIGH (ref 65–99)
Potassium: 4.5 mmol/L (ref 3.5–5.3)
Sodium: 135 mmol/L (ref 135–146)

## 2020-05-14 ENCOUNTER — Other Ambulatory Visit: Payer: Self-pay

## 2020-05-14 ENCOUNTER — Ambulatory Visit (HOSPITAL_COMMUNITY)
Admission: RE | Admit: 2020-05-14 | Discharge: 2020-05-14 | Disposition: A | Discharge status: Home / Self Care | Payer: Medicare Other | Source: Ambulatory Visit | Attending: Cardiology | Admitting: Cardiology

## 2020-05-14 DIAGNOSIS — R0989 Other specified symptoms and signs involving the circulatory and respiratory systems: Secondary | ICD-10-CM | POA: Insufficient documentation

## 2020-05-14 LAB — C. DIFFICILE GDH AND TOXIN A/B
GDH ANTIGEN: NOT DETECTED
MICRO NUMBER:: 11762336
SPECIMEN QUALITY:: ADEQUATE
TOXIN A AND B: NOT DETECTED

## 2020-05-21 ENCOUNTER — Ambulatory Visit: Payer: Medicare Other | Admitting: Neurology

## 2020-05-21 ENCOUNTER — Other Ambulatory Visit: Payer: Self-pay

## 2020-05-21 ENCOUNTER — Encounter: Payer: Self-pay | Admitting: Neurology

## 2020-05-21 VITALS — BP 138/58 | HR 49 | Ht 67.0 in | Wt 253.0 lb

## 2020-05-21 DIAGNOSIS — R410 Disorientation, unspecified: Secondary | ICD-10-CM

## 2020-05-21 DIAGNOSIS — R413 Other amnesia: Secondary | ICD-10-CM | POA: Diagnosis not present

## 2020-05-21 DIAGNOSIS — Z8669 Personal history of other diseases of the nervous system and sense organs: Secondary | ICD-10-CM | POA: Diagnosis not present

## 2020-05-21 NOTE — Progress Notes (Signed)
Subjective:    Patient ID: Travis [name] is a 75 y.o. male.  HPI     Interim history:   Travis [name] is a 75 year old right-handed gentleman with an underlying medical history of hypertension, hyperlipidemia, aortic stenosis, sleep apnea, paroxysmal A. fib, diabetes, and obesity, who Presents for follow-up consultation of Travis [name] memory loss.  Travis [name] is accompanied by Travis [name] again today.  I first met him at the request of Travis [name] primary care physician on 02/20/2020, at which time Travis [name] reported that Travis [name] had short-term memory issues for the previous 2 or 3 months.  Travis [name] had issues with confusion.  Travis [name] had had intermittent low blood sugar values as low as 21.  She also reported that Travis [name] sister had Alzheimer's dementia.  For Travis [name] sleep apnea, Travis [name] was advised to seek reevaluation but Travis [name] declined that.  Travis [name] had not been using Travis [name] CPAP in many years.  Travis [name] had not had a reevaluation in years.  Travis [name] MMSE was 18 out of 30 at the time.  Travis [name] had undergone a brain MRI on 02/11/2020.  I ordered an EEG.  Travis [name] had an interim EEG on 02/26/2020 and I reviewed the results: Summary  This is a mildly abnormal EEG due to the presence of mild bihemispheric slowing which is a nonspecific finding seen in variety of degenerative, toxic, metabolic and hypoxic etiologies.  No definite epileptiform activity was noted.    Today, 05/21/2020: Travis [name] reports feeling about the same.  Travis [name] reports that Travis [name] has not been in this clinic before.  Of note, Travis [name] recently saw Travis [name] primary care physician and was started on an antidepressant which was subsequently increased.  Travis [name] was then started about a month ago on low-dose donepezil 5 mg once daily.  Travis [name] seems to tolerate this.  [name] reports that Travis [name] memory is stable, slightly better even.  Travis [name] tries to hydrate better with water.  Unfortunately, Travis [name] had a recent fall.  Travis [name] fell outside as Travis [name] was mowing the lawn and Travis [name] fell over the lawnmower.  Travis [name] fell backwards.  Travis [name] did not hit Travis [name] head and did not lose consciousness.  Travis [name] did  bruise Travis [name] right lower back.  Travis [name] is currently only on Lantus insulin.  Travis [name] A1c on 05/12/20 was 8.6, which increased from 01/04/20, at which time it was 6.5.  Travis [name] had an echocardiogram on 04/09/2020 but results were limited.  Travis [name] had a carotid Doppler ultrasound on 05/14/2020, with no significant bilateral ICA stenosis noted.  Previously:   (Travis [name]) reports short-term memory issues for the past 2 to 3 months.  Travis [name] reports that in the past month Travis [name] symptoms have become worse, Travis [name] has had episodes of confusion and disorientation, is not easy to redirect, sometimes does not seem to recognize her.  Travis [name] has had episodes of hypoglycemia, just yesterday Travis [name] blood sugar went down to 21 but she was able to let him drink something orally to get it back up.  Sometimes Travis [name] seems confused all day.  She has not noticed any twitching or convulsions or staring spells.  Travis [name] does have an intermittent hand tremor.  Travis [name] also reports intermittent headaches which are global, short-lived typically.  Travis [name] reports that Travis [name] sister had Alzheimer's dementia, she lived to be into her 24s.  Travis [name] is the youngest of 4 altogether.  Travis [name] had 2 sisters and 1 brother, 1 sister passed away and 1 brother passed away.  Brother had heart disease.  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  Travis [name]  Travis [name] quit smoking 30+ years ago and drinks alcohol very rarely.  Lately Travis [name] is trying to increase Travis [name] water intake and Travis [name] has reduced Travis [name] diet soda intake but Travis [name] prefers diet soda.  Travis [name] drinks 1 cup of coffee per day on average.  Since Travis [name] incident of driving confused and landing in [place], almost in [place], Travis [name] has not driven.  Of note, Travis [name] is medical history lists obstructive sleep apnea or sleep apnea.  Travis [name] was diagnosed over 40 years ago and had a CPAP machine for a few months but has not used Travis [name] machine in decades.  Travis [name] has not had a reevaluation in many years, probably none since Travis [name] original diagnosis.  [name] reports  that someone told him many years ago that Travis [name] does not have obstructive sleep apnea.  Travis [name] no longer has a CPAP machine, gave it away.  Travis [name] is not willing to pursue another sleep study at this time.  Travis [name] feels that it would be a waste of time.  Travis [name] is not sure that Travis [name] would be using a CPAP as Travis [name] would probably take it off she explains.  She is interested in learning more about alternative treatment options and we talked about surgical options including inspire but I also explained that most insurances do not pay for any alternative options until CPAP is tried first.  I reviewed your office note from 02/07/2020.  Travis [name] reported that the time that Travis [name] had driven off and did not have recollection of how Travis [name] ended up away from home that far.  You ordered a brain MRI.  Travis [name] had a brain MRI without contrast on 02/11/2020 and I reviewed the results: IMPRESSION: 1. No acute intracranial abnormality. 2. 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 3. Moderate parenchymal volume loss and chronic small vessel ischemia. Of note, Travis [name] went to the emergency room on 02/18/2020 for hypoglycemia and headache. Travis [name] went to [facility] emergency room.  I reviewed the records.  Travis [name] had noticed confusion all day.  Travis [name] was not acting himself, Travis [name] was unable to drink orange juice at home so she called 911.  Upon EMS arrival, blood sugar value was 32.  Travis [name] was given D50 en route and again in the ER.  Travis [name] had a head CT without contrast through [facility] on 02/18/2020 and I reviewed the results:  No acute intracranial abnormality.    Age-appropriate involutional and ischemic gliotic changes.   There were advised to cut Travis [name] Lantus insulin in half at night.   Note, Travis [name] has not been using Travis [name] CPAP for about a year.  Travis [name] reports that Travis [name] could not tolerate the mask. The patient's allergies, current medications, family history, past medical history, past social history, past surgical history and  problem list were reviewed and updated as appropriate.    Travis [name] Past Medical History Is Significant For: Past Medical History:  Diagnosis Date  . Aortic stenosis    AVR 01/2009 with a Magna Ease #23 pericardial tissue valve; echo 07/22/11-EF 50-55%, aortic valve is well seated  . Chest pain    myoview 11-14-2008- normal study  . Diabetes mellitus type 2 in obese ([place])   . HTN (hypertension)   . Hyperlipemia   . Obesity   . OSA (obstructive sleep apnea)   . PAF (paroxysmal atrial fibrillation) (HCC)    on amiodarone briefly  . Sleep apnea  Travis [name] Past Surgical History Is Significant For: Past Surgical History:  Procedure Laterality Date  . AORTIC VALVE REPLACEMENT  01/15/2009   AVR with a 67m Magna Ease valve #[id number] model 3300TFX  . CARDIAC CATHETERIZATION  12/12/08   normal coronaries, nl EF, severe AS  . 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   fibrous malunion of sternotomy     Travis [name] Family History Is Significant For: Family History  Problem Relation Age of Onset  . Cancer Mother   . Diabetes Brother     Travis [name] Social History Is Significant For: Social History   Socioeconomic History  . Marital status: Married    Spouse name: Not on file  . Number of children: Not on file  . Years of education: Not on file  . Highest education level: Not on file  Occupational History  . Not on file  Tobacco Use  . Smoking status: Former Smoker    Types: Cigarettes    Quit date: 02/02/1988    Years since quitting: 32.3  . Smokeless tobacco: Never Used  Substance and Sexual Activity  . Alcohol use: No  . Drug use: No  . Sexual activity: Not on file  Other Topics Concern  . Not on file  Social History Narrative  . Not on file   Social Determinants of Health   Financial Resource Strain: Not on file  Food Insecurity: Not on file  Transportation Needs: Not on file  Physical Activity: Not on file  Stress: Not on file  Social Connections: Not on file    Travis [name] Allergies Are:   Allergies  Allergen Reactions  . Norvasc [Amlodipine Besylate] Itching  . Penicillins Itching  . Prinivil [Lisinopril] Itching  :   Travis [name] Current Medications Are:  Outpatient Encounter Medications as of 05/21/2020  Medication Sig  . Acetaminophen (TYLENOL PO) Take by mouth.  .Marland Kitchenaspirin 81 MG tablet Take 81 mg by mouth daily.  . Continuous Blood Gluc Receiver (DEXCOM G6 RECEIVER) DEVI Use to check glucose daily as needed.  Diagnosis: E11.649  . Continuous Blood Gluc Sensor (DEXCOM G6 SENSOR) MISC Replace every 10 days.  Use to monitor glucose Diagnosis: E11.649  . Continuous Blood Gluc Transmit (DEXCOM G6 TRANSMITTER) MISC Replace q3 months. Use to monitor glucose.  Diagnosis: E11.649  . donepezil (ARICEPT) 5 MG tablet Take 1 tablet (5 mg total) by mouth at bedtime.  .Marland Kitchenezetimibe-simvastatin (VYTORIN) 10-20 MG tablet TAKE 1 TABLET BY MOUTH AT  BEDTIME  . FLUoxetine (PROZAC) 40 MG capsule Take 1 capsule (40 mg total) by mouth daily.  . furosemide (LASIX) 40 MG tablet TAKE 1 TABLET BY MOUTH  TWICE DAILY  . glucose blood (ACCU-CHEK AVIVA PLUS) test strip   . hydrochlorothiazide (HYDRODIURIL) 25 MG tablet Take 1 tablet (25 mg total) by mouth daily.  . insulin glargine (LANTUS) 100 UNIT/ML injection Inject 45 Units into the skin daily.  . Insulin Pen Needle (B-D ULTRAFINE III SHORT PEN) 31G X 8 MM MISC USE TO INJECT ONCE DAILY AS DIRECTED  . losartan (COZAAR) 100 MG tablet TAKE 1 TABLET BY MOUTH  DAILY  . metoprolol succinate (TOPROL-XL) 100 MG 24 hr tablet TAKE 1 TABLET BY MOUTH  DAILY WITH OR IMMEDIATELY  FOLLOWING A MEAL  . potassium chloride SA (KLOR-CON) 20 MEQ tablet TAKE 1 TABLET BY MOUTH  DAILY   No facility-administered encounter medications on file as of 05/21/2020.  :  Review of Systems:  Out of a complete 14 point review of systems, all are reviewed and  negative with the exception of these symptoms as listed below: Review of Systems  Neurological:       Here for 3 month f/u.  Reports Travis [name] has been doing ok.  1 fall reported that took place last week. Pt reports Travis [name] was mowing and fell while trying to check the gas on the mower. Ended up falling on top of the mower and a neighbor had to come and help him up. No serious injuries, just soreness and a scrap on Travis [name] back.    Objective:  Neurological Exam  Physical Exam Physical Examination:   Vitals:   05/21/20 1046  BP: (!) 138/58  Pulse: (!) 49    General Examination: The patient is a very pleasant 75 y.o. male in no acute distress. Travis [name] appears well-developed and well-nourished and well groomed.   HEENT: Normocephalic, atraumatic, pupils are equal, round and reactive to light, tracking is preserved, face is symmetric with normal facial animation, hearing is grossly intact, speech is clear without dysarthria, airway examination reveals moderate to significantly crowded airway.  Tongue protrudes centrally and palate elevates symmetrically, no carotid bruits.    Chest: Clear to auscultation without wheezing, rhonchi or crackles noted.  Heart: S1+S2+0, regular and normal without murmurs, rubs or gallops noted.   Abdomen: Soft, non-tender and non-distended.  Extremities: There is 1-2+ pitting edema in the distal lower extremities bilaterally, with prominent purplish discoloration in the distal lower extremities.  Skin: Warm and dry with the exception of chronic appearing changes including thickening of the skin in the distal lower extremities.       Musculoskeletal: exam reveals no obvious joint deformities, tenderness or joint swelling.   Neurologically:  Mental status: The patient is awake, alert and oriented to self, situation, not able to provide a very detailed history, [name] is providing more of the history.  Speech is clear with normal prosody and enunciation. Thought process is linear. Mood is normal and affect is normal.   On 02/20/2020: MMSE: 18/30, CDT: 1/4, AFT: 11/min.  Cranial nerves II - XII  are as described above under HEENT exam.  Motor exam: Normal bulk, strength and tone is noted. There is no drift, resting tremor, and no rebound, no obvious postural or action tremor in both hands. Romberg is not tested due to safety concerns.  Fine motor skills are globally mildly impaired, no lateralizing signs.  Travis [name] stands with mild difficulty, Travis [name] stands naturally wider base.  Travis [name] walks slowly and somewhat cautiously, no shuffling, has preserved arm swing.  No walking aid.   Cerebellar testing shows no dysmetria or intention tremor.  Sensory testing shows intact sensation to light touch.  Assessment and Plan:   In summary, Travis [name] is a very pleasant 75 year old male with an underlying complex medical history of hypertension, hyperlipidemia, aortic stenosis, sleep apnea, paroxysmal A. fib, diabetes, and obesity, who presents for follow-up consultation of Travis [name] memory loss of at least 3 months duration.  Travis [name] has been on donepezil for the past nearly 2 months per PCP, 5 mg strength and seems to tolerate this.  Travis [name] reports that Travis [name] has done well with it.  Travis [name] has also been on generic Prozac for the past nearly 3 months and has tolerated it. Dose was increased from 20 mg to 40 mg about 2 months ago.  Travis [name] has a family history of Alzheimer's dementia. Travis [name] has vascular risk factors.  Travis [name] recent EEG did not show any focal abnormalities.  No seizure-like changes.  Travis [name] is  advised to continue with Travis [name] current medication regimen.  Travis [name] is advised to follow-up routinely in this clinic to see one of our nurse practitioners in 3 months, we can certainly consider increasing the donepezil to 10 mg at the time or in the near future.  Travis [name] has had some recent medication changes and is still working on optimization of Travis [name] diabetes control.  Travis [name] is currently only on Lantus insulin.  We also talked about sleep apnea.  Travis [name] is again advised to consider sleep testing, we could do a home sleep test to reevaluate for sleep apnea just to  make sure we are not missing an opportunity to treat him for an underlying vascular risk factor.  Travis [name] declines this again today.  I answered all their questions today and the patient and Travis [name] are in agreement.  We plan to do another MMSE at the next visit. I spent 40 minutes in total face-to-face time and in reviewing records during pre-charting, more than 50% of which was spent in counseling and coordination of care, reviewing test results, reviewing medications and treatment regimen and/or in discussing or reviewing the diagnosis of memory loss, the prognosis and treatment options. Pertinent laboratory and imaging test results that were available during this visit with the patient were reviewed by me and considered in my medical decision making (see chart for details).

## 2020-05-21 NOTE — Patient Instructions (Signed)
It was good to see you both again today.  As discussed, we will have you continue with the memory medication, donepezil 5 mg once daily.  We will make a follow-up appointment in 3 months for you to see one of our nurse practitioners, at which point we can consider increasing your donepezil to 10 mg once daily.  Please continue to pursue good hydration with water.  Please consider repeating your sleep study for reevaluation of your sleep apnea diagnosis. We can certainly do a home sleep test if you would like.  Let us know if you change your mind about sleep testing at home.

## 2020-06-10 ENCOUNTER — Encounter (HOSPITAL_COMMUNITY): Payer: Self-pay

## 2020-06-10 ENCOUNTER — Other Ambulatory Visit: Payer: Self-pay

## 2020-06-10 ENCOUNTER — Ambulatory Visit (HOSPITAL_COMMUNITY): Payer: Medicare Other

## 2020-06-25 ENCOUNTER — Other Ambulatory Visit: Payer: Self-pay | Admitting: Cardiovascular Disease

## 2020-07-02 ENCOUNTER — Other Ambulatory Visit: Payer: Self-pay

## 2020-07-02 ENCOUNTER — Telehealth: Payer: Self-pay

## 2020-07-02 NOTE — Telephone Encounter (Signed)
Patient scheduled for Friday 07/04/20. AM

## 2020-07-02 NOTE — Telephone Encounter (Signed)
Spoke to pt. He states that he tried everything Dr. Anastasio Auerbach suggested to help with diarrhea. There have been no changes. Diarrhea is getting worse and "becoming a real situation".   Requesting additional information of help. Sent msg to Dr. Ashley Royalty.

## 2020-07-02 NOTE — Telephone Encounter (Signed)
Please call patient and schedule appt for diarrhea within the next 2 days.

## 2020-07-02 NOTE — Telephone Encounter (Signed)
Please have him follow up

## 2020-07-04 ENCOUNTER — Encounter: Payer: Self-pay | Admitting: Family Medicine

## 2020-07-04 ENCOUNTER — Ambulatory Visit (INDEPENDENT_AMBULATORY_CARE_PROVIDER_SITE_OTHER): Payer: Medicare Other | Admitting: Family Medicine

## 2020-07-04 ENCOUNTER — Other Ambulatory Visit: Payer: Self-pay

## 2020-07-04 VITALS — BP 137/75 | HR 52 | Ht 68.0 in | Wt 253.0 lb

## 2020-07-04 DIAGNOSIS — R32 Unspecified urinary incontinence: Secondary | ICD-10-CM

## 2020-07-04 DIAGNOSIS — R197 Diarrhea, unspecified: Secondary | ICD-10-CM

## 2020-07-04 DIAGNOSIS — R2681 Unsteadiness on feet: Secondary | ICD-10-CM | POA: Diagnosis not present

## 2020-07-04 NOTE — Patient Instructions (Signed)
Great to see you! We'll be in touch with lab results.  Hold donepezil (aricept) for 5 days.  Let me know if diarrhea improves.  Let me know if you decide to try therapy for balance.

## 2020-07-05 LAB — MICROSCOPIC MESSAGE

## 2020-07-05 LAB — COMPLETE METABOLIC PANEL WITH GFR
AG Ratio: 1.5 (calc) (ref 1.0–2.5)
ALT: 17 U/L (ref 9–46)
AST: 16 U/L (ref 10–35)
Albumin: 4.1 g/dL (ref 3.6–5.1)
Alkaline phosphatase (APISO): 66 U/L (ref 35–144)
BUN/Creatinine Ratio: 16 (calc) (ref 6–22)
BUN: 27 mg/dL — ABNORMAL HIGH (ref 7–25)
CO2: 26 mmol/L (ref 20–32)
Calcium: 9.3 mg/dL (ref 8.6–10.3)
Chloride: 101 mmol/L (ref 98–110)
Creat: 1.7 mg/dL — ABNORMAL HIGH (ref 0.70–1.18)
GFR, Est African American: 44 mL/min/{1.73_m2} — ABNORMAL LOW (ref >=60)
GFR, Est Non African American: 38 mL/min/{1.73_m2} — ABNORMAL LOW (ref >=60)
Globulin: 2.7 g/dL (calc) (ref 1.9–3.7)
Glucose, Bld: 248 mg/dL — ABNORMAL HIGH (ref 65–99)
Potassium: 4.8 mmol/L (ref 3.5–5.3)
Sodium: 136 mmol/L (ref 135–146)
Total Bilirubin: 0.5 mg/dL (ref 0.2–1.2)
Total Protein: 6.8 g/dL (ref 6.1–8.1)

## 2020-07-05 LAB — URINALYSIS, ROUTINE W REFLEX MICROSCOPIC
Bacteria, UA: NONE SEEN /HPF
Bilirubin Urine: NEGATIVE
Hgb urine dipstick: NEGATIVE
Hyaline Cast: NONE SEEN /LPF
Ketones, ur: NEGATIVE
Leukocytes,Ua: NEGATIVE
Nitrite: NEGATIVE
RBC / HPF: NONE SEEN /HPF (ref 0–2)
Specific Gravity, Urine: 1.019 (ref 1.001–1.035)
Squamous Epithelial / HPF: NONE SEEN /HPF (ref <=5)
WBC, UA: NONE SEEN /HPF (ref 0–5)
pH: 5.5 (ref 5.0–8.0)

## 2020-07-05 LAB — URINE CULTURE
MICRO NUMBER:: 11967247
SPECIMEN QUALITY:: ADEQUATE

## 2020-07-05 LAB — PSA: PSA: 1.62 ng/mL (ref <=4.00)

## 2020-07-06 DIAGNOSIS — R2681 Unsteadiness on feet: Secondary | ICD-10-CM | POA: Insufficient documentation

## 2020-07-06 DIAGNOSIS — R197 Diarrhea, unspecified: Secondary | ICD-10-CM | POA: Insufficient documentation

## 2020-07-06 DIAGNOSIS — R32 Unspecified urinary incontinence: Secondary | ICD-10-CM | POA: Insufficient documentation

## 2020-07-06 NOTE — Assessment & Plan Note (Signed)
Continue use of cane.  Recommend referral to PT, declines at this time.

## 2020-07-06 NOTE — Assessment & Plan Note (Signed)
Checking UA and PSA

## 2020-07-06 NOTE — Assessment & Plan Note (Addendum)
This may a side effect of donepezil.  He will try holding this for a few days and see if this makes a difference in his symptoms.  If not improving with this we can consider referral to GI

## 2020-07-06 NOTE — Progress Notes (Signed)
Travis [name] - 75 y.o. male MRN [id number]  Date of birth: 06-Mar-1945  Subjective No chief complaint on file.   HPI Travis [name] is a 75 y.o. male here today for follow up of diarrhea.  He reports that he continues to have episodes of diarrhea.  He does have some normal bowel movements but has at least 1 episode of watery diarrhea each day.  He has not had blood in his stool.  He denies nausea or appetite change.  C. Diff checked previously and negative.  This did start about the same time that we started donepezil.  He does also reports some urinary incontinence, has some urgency and dribbling of urine.  He denies pain with urination.  He hasn't really noted increased frequency.  He has had some issues with balance as well and had a fall a couple of weeks ago.  MRI in January with normal ventricles.   ROS:  A comprehensive ROS was completed and negative except as noted per HPI  Allergies  Allergen Reactions  . Norvasc [Amlodipine Besylate] Itching  . Penicillins Itching  . Prinivil [Lisinopril] Itching    Past Medical History:  Diagnosis Date  . Aortic stenosis    AVR 01/2009 with a Magna Ease #23 pericardial tissue valve; echo 07/22/11-EF 50-55%, aortic valve is well seated  . Chest pain    myoview 11-14-2008- normal study  . Diabetes mellitus type 2 in obese (HCC)   . HTN (hypertension)   . Hyperlipemia   . Obesity   . OSA (obstructive sleep apnea)   . PAF (paroxysmal atrial fibrillation) (HCC)    on amiodarone briefly  . Sleep apnea     Past Surgical History:  Procedure Laterality Date  . 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 CARDIAC CATHETERIZATION  12/12/08   normal coronaries, nl EF, severe AS  . 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   fibrous malunion of sternotomy     Social History   Socioeconomic History  . Marital status: Married    Spouse name: Not on file  . Number of children: Not on file  . Years  of education: Not on file  . Highest education level: Not on file  Occupational History  . Not on file  Tobacco Use  . Smoking status: Former Smoker    Types: Cigarettes    Quit date: 02/02/1988    Years since quitting: 32.4  . Smokeless tobacco: Never Used  Substance and Sexual Activity  . Alcohol use: No  . Drug use: No  . Sexual activity: Not on file  Other Topics Concern  . Not on file  Social History Narrative  . Not on file   Social Determinants of Health   Financial Resource Strain: Not on file  Food Insecurity: Not on file  Transportation Needs: Not on file  Physical Activity: Not on file  Stress: Not on file  Social Connections: Not on file    Family History  Problem Relation Age of Onset  . Cancer Mother   . Diabetes Brother     Health Maintenance  Topic Date Due  . FOOT EXAM  Never done  . OPHTHALMOLOGY EXAM  Never done  . Hepatitis C Screening  Never done  . Zoster Vaccines- Shingrix (1 of 2) Never done  . Pneumococcal Vaccine 80-73 Years old (1 of 2 - PPSV23) Never done  . INFLUENZA VACCINE  09/01/2020  . HEMOGLOBIN A1C  11/11/2020  . TETANUS/TDAP  10/14/2025  . COVID-19 Vaccine  Completed  . PNA vac Low Risk Adult  Completed  . HPV VACCINES  Aged Out     ----------------------------------------------------------------------------------------------------------------------------------------------------------------------------------------------------------------- Physical Exam BP 137/75 (BP Location: Left Arm, Patient Position: Sitting, Cuff Size: Large)   Pulse (!) 52   Ht 5\' 8"  (1.727 m)   Wt 253 lb (114.8 kg)   SpO2 98%   BMI 38.47 kg/m   Physical Exam Constitutional:      Appearance: Normal appearance.  HENT:     Head: Normocephalic and atraumatic.  Eyes:     General: No scleral icterus. Cardiovascular:     Rate and Rhythm: Normal rate and regular rhythm.  Pulmonary:     Effort: Pulmonary effort is normal.     Breath sounds: Normal  breath sounds.  Abdominal:     General: Abdomen is flat. There is no distension.     Palpations: Abdomen is soft.     Tenderness: There is no abdominal tenderness.  Musculoskeletal:     Cervical back: Neck supple.  Neurological:     General: No focal deficit present.     Mental Status: He is alert.  Psychiatric:        Mood and Affect: Mood normal.        Behavior: Behavior normal.     ------------------------------------------------------------------------------------------------------------------------------------------------------------------------------------------------------------------- Assessment and Plan  Diarrhea This may a side effect of donepezil.  He will try holding this for a few days and see if this makes a difference in his symptoms.  If not improving with this we can consider referral to GI  Urinary incontinence Checking UA and PSA   No orders of the defined types were placed in this encounter.   No follow-ups on file.    This visit occurred during the SARS-CoV-2 public health emergency.  Safety protocols were in place, including screening questions prior to the visit, additional usage of staff PPE, and extensive cleaning of exam room while observing appropriate contact time as indicated for disinfecting solutions.

## 2020-07-17 ENCOUNTER — Encounter: Payer: Self-pay | Admitting: Family Medicine

## 2020-07-18 MED ORDER — BD PEN NEEDLE NANO U/F 32G X 4 MM MISC: 3 refills | Status: AC

## 2020-07-18 MED ORDER — TAMSULOSIN HCL 0.4 MG PO CAPS: 0.4000 mg | ORAL_CAPSULE | Freq: Every day | ORAL | 3 refills | Status: AC

## 2020-07-23 ENCOUNTER — Other Ambulatory Visit: Payer: Self-pay | Admitting: Cardiovascular Disease

## 2020-07-26 DIAGNOSIS — A419 Sepsis, unspecified organism: Secondary | ICD-10-CM | POA: Insufficient documentation

## 2020-07-27 DIAGNOSIS — N401 Enlarged prostate with lower urinary tract symptoms: Secondary | ICD-10-CM | POA: Insufficient documentation

## 2020-08-05 ENCOUNTER — Telehealth: Payer: Self-pay | Admitting: General Practice

## 2020-08-05 NOTE — Telephone Encounter (Signed)
Transition Care Management Follow-up Telephone Call Date of discharge and from where: 08/01/20 from [facility] How have you been since you were released from the hospital? Still having diarrhea and keeps falling.  Any questions or concerns? No  Items Reviewed: Did the pt receive and understand the discharge instructions provided? Yes  Medications obtained and verified? Yes  Other? No  Any new allergies since your discharge? No  Dietary orders reviewed? Yes Do you have support at home? Yes   Home Care and Equipment/Supplies: Were home health services ordered? Yes, [facility]  Functional Questionnaire: (I = Independent and D = Dependent) ADLs: I  Bathing/Dressing- I  Meal Prep- I  Eating- I  Maintaining continence- I  Transferring/Ambulation- I  Managing Meds- I  Follow up appointments reviewed:  PCP Hospital f/u appt confirmed? Yes  Scheduled to see Dr. Ashley Royalty on 08/06/20 @ 1310. Specialist Hospital f/u appt confirmed? No  Scheduled to see Infectious disease on 08/06/20 @ 0820. Are transportation arrangements needed? No  If their condition worsens, is the pt aware to call PCP or go to the Emergency Dept.? Yes Was the patient provided with contact information for the PCP's office or ED? Yes Was to pt encouraged to call back with questions or concerns? Yes

## 2020-08-06 ENCOUNTER — Other Ambulatory Visit: Payer: Self-pay

## 2020-08-06 ENCOUNTER — Ambulatory Visit (INDEPENDENT_AMBULATORY_CARE_PROVIDER_SITE_OTHER): Payer: Medicare Other | Admitting: Family Medicine

## 2020-08-06 ENCOUNTER — Encounter: Payer: Self-pay | Admitting: Family Medicine

## 2020-08-06 DIAGNOSIS — I10 Essential (primary) hypertension: Secondary | ICD-10-CM | POA: Diagnosis not present

## 2020-08-06 DIAGNOSIS — J189 Pneumonia, unspecified organism: Secondary | ICD-10-CM | POA: Diagnosis not present

## 2020-08-06 DIAGNOSIS — B9561 Methicillin susceptible Staphylococcus aureus infection as the cause of diseases classified elsewhere: Secondary | ICD-10-CM

## 2020-08-06 DIAGNOSIS — R7881 Bacteremia: Secondary | ICD-10-CM | POA: Diagnosis not present

## 2020-08-06 DIAGNOSIS — T148XXA Other injury of unspecified body region, initial encounter: Secondary | ICD-10-CM

## 2020-08-06 DIAGNOSIS — Z9181 History of falling: Secondary | ICD-10-CM | POA: Insufficient documentation

## 2020-08-06 DIAGNOSIS — E1169 Type 2 diabetes mellitus with other specified complication: Secondary | ICD-10-CM

## 2020-08-06 DIAGNOSIS — R2681 Unsteadiness on feet: Secondary | ICD-10-CM

## 2020-08-06 DIAGNOSIS — E669 Obesity, unspecified: Secondary | ICD-10-CM

## 2020-08-06 MED ORDER — MUPIROCIN 2 % EX OINT: 1.0000 "application " | TOPICAL_OINTMENT | Freq: Two times a day (BID) | CUTANEOUS | 0 refills | Status: AC

## 2020-08-06 MED ORDER — HYDROCODONE-ACETAMINOPHEN 5-325 MG PO TABS: 1.0000 | ORAL_TABLET | Freq: Four times a day (QID) | ORAL | 0 refills | Status: AC | PRN

## 2020-08-06 NOTE — Assessment & Plan Note (Addendum)
He is hypotensive in the clinic today which I suspect is contributing to his fatigue feeling as well as his dizziness.  I will have him decrease his losartan to 50 mg daily.  Increase fluid intake recommended.

## 2020-08-06 NOTE — Assessment & Plan Note (Signed)
He has had some dizziness which I think may be related to hypotension.  Adjusted blood pressure medications today.  Home health physical therapy ordered.

## 2020-08-06 NOTE — Assessment & Plan Note (Signed)
Skin abrasion to left nipple area.  Prescription for topical mupirocin to be applied twice daily.

## 2020-08-06 NOTE — Assessment & Plan Note (Signed)
Blood sugars remain stable with current medications.

## 2020-08-06 NOTE — Patient Instructions (Addendum)
Reduce losartan to 50mg  (1/2 tab) each day.  Be sure to drink plenty of fluids each day.   Try mupirocin ointment to areas on chest.   Let's follow up in 2 weeks.

## 2020-08-06 NOTE — Progress Notes (Signed)
Travis [name] - 75 y.o. male MRN [id number]  Date of birth: 1945/05/26  Subjective No chief complaint on file.   HPI Travis [name] is a 75 year old male here today for hospital follow-up.  He was hospitalized from 07/25/2020 to 08/01/2020.  He was recently hospitalized due to lethargy and altered mental status secondary to hypoxic respiratory failure.  Found to have community-acquired pneumonia.  His cultures also grew MSSA.  He was started on cefazolin in the hospital.  Infectious disease was consulted and recommend that he complete a 4-week course of this.  He did have a TTE and TEE without vegetation o of bioprosthetic valve.  He improved clinically and his lactic acidosis resolved during his hospitalization.  PICC line was placed for continued antibiotic therapy prior to discharge.  Today he reports that he has some continued fatigue as well as some dizziness.  He denies any difficulty breathing, shortness of breath, chest pain, fever or chills.  His wife is administering Ancef at home.  He does have home health nursing coming out 1-2 times per week.  He had follow-up with infectious disease today.  Labs from this visit are pending.  He does also continue to have some diarrhea.  ROS:  A comprehensive ROS was completed and negative except as noted per HPI  Allergies  Allergen Reactions   Norvasc [Amlodipine Besylate] Itching   Penicillins Itching   Prinivil [Lisinopril] Itching    Past Medical History:  Diagnosis Date   Aortic stenosis    AVR 01/2009 with a Magna Ease #23 pericardial tissue valve; echo 07/22/11-EF 50-55%, aortic valve is well seated   Chest pain    myoview 11-14-2008- normal study   Diabetes mellitus type 2 in obese (HCC)    HTN (hypertension)    Hyperlipemia    Obesity    OSA (obstructive sleep apnea)    PAF (paroxysmal atrial fibrillation) (HCC)    on amiodarone briefly   Sleep apnea     Past Surgical History:  Procedure Laterality Date   AORTIC VALVE REPLACEMENT   01/15/2009   AVR with a 89mm Magna Ease valve [id number], model 3300TFX   CARDIAC CATHETERIZATION  12/12/08   normal coronaries, nl EF, severe AS   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   fibrous malunion of sternotomy     Social History   Socioeconomic History   Marital status: Married    Spouse name: Not on file   Number of children: Not on file   Years of education: Not on file   Highest education level: Not on file  Occupational History   Not on file  Tobacco Use   Smoking status: Former    Pack years: 0.00    Types: Cigarettes    Quit date: 02/02/1988    Years since quitting: 32.5   Smokeless tobacco: Never  Substance and Sexual Activity   Alcohol use: No   Drug use: No   Sexual activity: Not on file  Other Topics Concern   Not on file  Social History Narrative   Not on file   Social Determinants of Health   Financial Resource Strain: Not on file  Food Insecurity: Not on file  Transportation Needs: Not on file  Physical Activity: Not on file  Stress: Not on file  Social Connections: Not on file    Family History  Problem Relation Age of Onset   Cancer Mother    Diabetes Brother     Health Maintenance  Topic Date Due   FOOT EXAM  Never done   OPHTHALMOLOGY EXAM  Never done   Hepatitis C Screening  Never done   Zoster Vaccines- Shingrix (1 of 2) Never done   COVID-19 Vaccine (4 - Booster for Pfizer series) 05/20/2020   INFLUENZA VACCINE  09/01/2020   HEMOGLOBIN A1C  11/11/2020   TETANUS/TDAP  10/14/2025   PNA vac Low Risk Adult  Completed   HPV VACCINES  Aged Out     ----------------------------------------------------------------------------------------------------------------------------------------------------------------------------------------------------------------- Physical Exam BP 100/61   Pulse 73   Ht 5\' 8"  (1.727 m)   Wt 246 lb (111.6 kg)   SpO2 95%   BMI 37.40 kg/m   Physical Exam Constitutional:      Appearance: Normal appearance.   HENT:     Head: Normocephalic and atraumatic.  Eyes:     General: No scleral icterus. Cardiovascular:     Rate and Rhythm: Normal rate and regular rhythm.  Pulmonary:     Effort: Pulmonary effort is normal.     Breath sounds: Normal breath sounds.  Abdominal:     General: Abdomen is flat.  Musculoskeletal:     Cervical back: Neck supple.     Comments: PICC line right arm  Neurological:     General: No focal deficit present.     Mental Status: He is alert.  Psychiatric:        Mood and Affect: Mood normal.        Behavior: Behavior normal.    ------------------------------------------------------------------------------------------------------------------------------------------------------------------------------------------------------------------- Assessment and Plan  MSSA bacteremia Unclear source.  Possibly from skin or pneumonia.  No vegetations noted on bioprosthetic valve.  He will complete 4-week course of cefazolin.  He continues to follow with ID as well.  We will also need to monitor for any joint pain related to his prosthetic joints.  Gait instability He has had some dizziness which I think may be related to hypotension.  Adjusted blood pressure medications today.  Home health physical therapy ordered.  Essential hypertension He is hypotensive in the clinic today which I suspect is contributing to his fatigue feeling as well as his dizziness.  I will have him decrease his losartan to 50 mg daily.  Increase fluid intake recommended.  Diabetes mellitus type 2 in obese Blood sugars remain [name] with current medications.  Skin abrasion Skin abrasion to left nipple area.  Prescription for topical mupirocin to be applied twice daily.   Meds ordered this encounter  Medications   mupirocin ointment (BACTROBAN) 2 %    Sig: Apply 1 application topically 2 (two) times daily.    Dispense:  22 g    Refill:  0   HYDROcodone-acetaminophen (NORCO) 5-325 MG tablet     Sig: Take 1 tablet by mouth every 6 (six) hours as needed for moderate pain.    Dispense:  20 tablet    Refill:  0    Return in about 2 weeks (around 08/20/2020) for Blood pressure/Fatigue.    This visit occurred during the SARS-CoV-2 public health emergency.  Safety protocols were in place, including screening questions prior to the visit, additional usage of staff PPE, and extensive cleaning of exam room while observing appropriate contact time as indicated for disinfecting solutions.

## 2020-08-06 NOTE — Assessment & Plan Note (Addendum)
Unclear source.  Possibly from skin or pneumonia.  No vegetations noted on bioprosthetic valve.  He will complete 4-week course of cefazolin.  He continues to follow with ID as well.  We will also need to monitor for any joint pain related to his prosthetic joints.

## 2020-08-14 ENCOUNTER — Encounter: Payer: Self-pay | Admitting: Family Medicine

## 2020-08-20 ENCOUNTER — Ambulatory Visit: Payer: Medicare Other | Admitting: Family Medicine

## 2020-08-20 ENCOUNTER — Encounter: Payer: Self-pay | Admitting: Family Medicine

## 2020-08-20 VITALS — BP 121/71 | HR 57 | Ht 67.0 in | Wt 235.0 lb

## 2020-08-20 DIAGNOSIS — B9561 Methicillin susceptible Staphylococcus aureus infection as the cause of diseases classified elsewhere: Secondary | ICD-10-CM

## 2020-08-20 DIAGNOSIS — R413 Other amnesia: Secondary | ICD-10-CM

## 2020-08-20 DIAGNOSIS — R7881 Bacteremia: Secondary | ICD-10-CM

## 2020-08-20 DIAGNOSIS — Z8669 Personal history of other diseases of the nervous system and sense organs: Secondary | ICD-10-CM

## 2020-08-20 NOTE — Patient Instructions (Signed)
Below is our plan:  We will resume donepezil 5mg  daily. I recommend starting with 5mg  every other day for 1-2 weeks. Please monitor closely for any concerns of worsening diarrhea or any shortness of breath, difficulty breathing or chest pain. If you tolerate it well increase dose to 5mg  daily. If in a few weeks/months you wish to increase dose to 10mg , call me and we will increase dose. Continue close follow up with PCP and care team!   Please make sure you are staying well hydrated. I recommend 50-60 ounces daily. Well balanced diet and regular exercise encouraged. Consistent sleep schedule with 6-8 hours recommended.   Please continue follow up with care team as directed.   Follow up with neurology in 6 months   You may receive a survey regarding today's visit. I encourage you to leave honest feed back as I do use this information to improve patient care. Thank you for seeing me today!    Management of Memory Problems   There are some general things you can do to help manage your memory problems.  Your memory may not in fact recover, but by using techniques and strategies you will be able to manage your memory difficulties better.   1)  Establish a routine. Try to establish and then stick to a regular routine.  By doing this, you will get used to what to expect and you will reduce the need to rely on your memory.  Also, try to do things at the same time of day, such as taking your medication or checking your calendar first thing in the morning. Think about think that you can do as a part of a regular routine and make a list.  Then enter them into a daily planner to remind you.  This will help you establish a routine.   2)  Organize your environment. Organize your environment so that it is uncluttered.  Decrease visual stimulation.  Place everyday items such as keys or cell phone in the same place every day (ie.  Basket next to front door) Use post it notes with a brief message to yourself  (ie. Turn off light, lock the door) Use labels to indicate where things go (ie. Which cupboards are for food, dishes, etc.) Keep a notepad and pen by the telephone to take messages   3)  Memory Aids A diary or journal/notebook/daily planner Making a list (shopping list, chore list, to do list that needs to be done) Using an alarm as a reminder (kitchen timer or cell phone alarm) Using cell phone to store information (Notes, Calendar, Reminders) Calendar/White board placed in a prominent position Post-it notes   In order for memory aids to be useful, you need to have good habits.  It's no good remembering to make a note in your journal if you don't remember to look in it.  Try setting aside a certain time of day to look in journal.   4)  Improving mood and managing fatigue. There may be other factors that contribute to memory difficulties.  Factors, such as anxiety, depression and tiredness can affect memory. Regular gentle exercise can help improve your mood and give you more energy. Simple relaxation techniques may help relieve symptoms of anxiety Try to get back to completing activities or hobbies you enjoyed doing in the past. Learn to pace yourself through activities to decrease fatigue. Find out about some local support groups where you can share experiences with others. Try and achieve 7-8 hours of sleep  at night.

## 2020-08-20 NOTE — Progress Notes (Addendum)
Chief Complaint  Patient presents with   Follow-up    Pt with wife. New room. Pt had started the aricept and was doing well. Stopped recently due to diarrhea episode but they dont think the two were related.      HISTORY OF PRESENT ILLNESS: 08/20/20 ALL:  Travis [name] is a 75 y.o. male here today for follow up for memory loss. He was previously doing well on donepezil $RemoveBefo'5mg'CpNmgzUAjhG$  daily. He started having frequent bouts of diarrhea in May, 2022. He discontinued donepezil to see if this helped but made no significant difference. In June, he was diagnosed with sepsis and MSSA. He is not home and receiving IV antibiotics multiple times daily. He feels that his memory waxes and wanes. He has had difficulty sleeping and feels more down. He is not as active. He is not able to work. His wife is helping to care for him. He is followed closely by PCP. He was last seen 2 weeks ago. He plans to start PT soon. He wishes to resume donepezil.    HISTORY (copied from Dr Guadelupe Sabin previous note)  Mr. Travis [name] is a 75 year old right-handed gentleman with an underlying medical history of hypertension, hyperlipidemia, aortic stenosis, sleep apnea, paroxysmal A. fib, diabetes, and obesity, who Presents for follow-up consultation of his memory loss.  He is accompanied by his wife again today.  I first met him at the request of his primary care physician on 02/20/2020, at which time his wife reported that he had short-term memory issues for the previous 2 or 3 months.  He had issues with confusion.  He had had intermittent low blood sugar values as low as 21.  She also reported that his sister had Alzheimer's dementia.  For his sleep apnea, he was advised to seek reevaluation but he declined that.  He had not been using his CPAP in many years.  He had not had a reevaluation in years.  His MMSE was 18 out of 30 at the time.  He had undergone a brain MRI on 02/11/2020.  I ordered an EEG.  He had an interim EEG on 02/26/2020 and I  reviewed the results: Summary This is a mildly abnormal EEG due to the presence of mild bihemispheric slowing which is a nonspecific finding seen in variety of degenerative, toxic, metabolic and hypoxic etiologies.  No definite epileptiform activity was noted.    Today, 05/21/2020: He reports feeling about the same.  He reports that he has not been in this clinic before.  Of note, he recently saw his primary care physician and was started on an antidepressant which was subsequently increased.  He was then started about a month ago on low-dose donepezil 5 mg once daily.  He seems to tolerate this.  Wife reports that his memory is stable, slightly better even.  He tries to hydrate better with water.  Unfortunately, he had a recent fall.  He fell outside as he was mowing the lawn and he fell over the lawnmower.  He fell backwards.  He did not hit his head and did not lose consciousness.  He did bruise his right lower back.  He is currently only on Lantus insulin.   His A1c on 05/12/20 was 8.6, which increased from 01/04/20, at which time it was 6.5.   He had an echocardiogram on 04/09/2020 but results were limited.  He had a carotid Doppler ultrasound on 05/14/2020, with no significant bilateral ICA stenosis noted.   REVIEW OF SYSTEMS:  Out of a complete 14 system review of symptoms, the patient complains only of the following symptoms, weakness, insomnia, fatigue, depression, memory loss, and all other reviewed systems are negative.   ALLERGIES: Allergies  Allergen Reactions   Norvasc [Amlodipine Besylate] Itching   Penicillins Itching   Prinivil [Lisinopril] Itching     HOME MEDICATIONS: Outpatient Medications Prior to Visit  Medication Sig Dispense Refill   Acetaminophen (TYLENOL PO) Take by mouth.     aspirin 81 MG tablet Take 81 mg by mouth daily.     Continuous Blood Gluc Receiver (DEXCOM G6 RECEIVER) DEVI Use to check glucose daily as needed.  Diagnosis: E11.649 1 each 0   Continuous Blood  Gluc Sensor (DEXCOM G6 SENSOR) MISC Replace every 10 days.  Use to monitor glucose Diagnosis: E11.649 12 each 3   Continuous Blood Gluc Transmit (DEXCOM G6 TRANSMITTER) MISC Replace q3 months. Use to monitor glucose.  Diagnosis: E11.649 1 each 4   ezetimibe-simvastatin (VYTORIN) 10-20 MG tablet TAKE 1 TABLET BY MOUTH AT  BEDTIME 90 tablet 3   FLUoxetine (PROZAC) 40 MG capsule Take 1 capsule (40 mg total) by mouth daily. 90 capsule 1   furosemide (LASIX) 40 MG tablet TAKE 1 TABLET BY MOUTH  TWICE DAILY 180 tablet 3   glucose blood (ACCU-CHEK AVIVA PLUS) test strip      hydrochlorothiazide (HYDRODIURIL) 25 MG tablet Take 1 tablet (25 mg total) by mouth daily. 90 tablet 1   HYDROcodone-acetaminophen (NORCO) 5-325 MG tablet Take 1 tablet by mouth every 6 (six) hours as needed for moderate pain. 20 tablet 0   insulin glargine (LANTUS) 100 UNIT/ML injection Inject 50 Units into the skin daily.     Insulin Pen Needle (BD PEN NEEDLE NANO U/F) 32G X 4 MM MISC Use to inject insulin once daily 100 each 3   losartan (COZAAR) 100 MG tablet TAKE 1 TABLET BY MOUTH  DAILY 90 tablet 3   metoprolol succinate (TOPROL-XL) 100 MG 24 hr tablet TAKE 1 TABLET BY MOUTH  DAILY WITH OR IMMEDIATELY  FOLLOWING A MEAL 90 tablet 1   mupirocin ointment (BACTROBAN) 2 % Apply 1 application topically 2 (two) times daily. 22 g 0   potassium chloride SA (KLOR-CON) 20 MEQ tablet TAKE 1 TABLET BY MOUTH  DAILY 90 tablet 3   tamsulosin (FLOMAX) 0.4 MG CAPS capsule Take 1 capsule (0.4 mg total) by mouth daily. 30 capsule 3   No facility-administered medications prior to visit.     PAST MEDICAL HISTORY: Past Medical History:  Diagnosis Date   Aortic stenosis    AVR 01/2009 with a Magna Ease #23 pericardial tissue valve; echo 07/22/11-EF 50-55%, aortic valve is well seated   Chest pain    myoview 11-14-2008- normal study   Diabetes mellitus type 2 in obese (HCC)    HTN (hypertension)    Hyperlipemia    Obesity    OSA (obstructive  sleep apnea)    PAF (paroxysmal atrial fibrillation) (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   fibrous malunion of sternotomy      FAMILY HISTORY: Family History  Problem Relation Age of Onset   Cancer Mother  Diabetes Brother      SOCIAL HISTORY: Social History   Socioeconomic History   Marital status: Married    Spouse name: Not on file   Number of children: Not on file   Years of education: Not on file   Highest education level: Not on file  Occupational History   Not on file  Tobacco Use   Smoking status: Former    Types: Cigarettes    Quit date: 02/02/1988    Years since quitting: 32.5   Smokeless tobacco: Never  Substance and Sexual Activity   Alcohol use: No   Drug use: No   Sexual activity: Not on file  Other Topics Concern   Not on file  Social History Narrative   Not on file   Social Determinants of Health   Financial Resource Strain: Not on file  Food Insecurity: Not on file  Transportation Needs: Not on file  Physical Activity: Not on file  Stress: Not on file  Social Connections: Not on file  Intimate Partner Violence: Not on file     PHYSICAL EXAM  Vitals:   08/20/20 1251  BP: 121/71  Pulse: (!) 57  Weight: 235 lb (106.6 kg)  Height: _0  (1.702 m)   Body mass index is 36.81 kg/m.   Generalized: Well developed, in no acute distress  Cardiology: normal rate and rhythm, no murmur auscultated  Respiratory: clear to auscultation bilaterally    Neurological examination  Mentation: Alert oriented to time, place, history taking. Follows all commands speech and language fluent Cranial nerve II-XII: Pupils were equal round reactive to light.  Extraocular movements were full, visual field were full on confrontational test. Facial sensation and strength were normal. Head turning and shoulder shrug  were normal and symmetric. Motor: The motor testing reveals 5 over 5 strength of all 4 extremities. Good symmetric motor tone is noted throughout.  Gait and station: Gait not assessed today as he is in a wheelchair.    DIAGNOSTIC DATA (LABS, IMAGING, TESTING) - I reviewed patient records, labs, notes, testing and imaging myself where available.  Lab Results  Component Value Date   WBC 8.9 05/12/2020   HGB 13.6 05/12/2020   HCT 40.5 05/12/2020   MCV 90.6 05/12/2020   PLT 179 05/12/2020      Component Value Date/Time   NA 136 07/04/2020 1122   K 4.8 07/04/2020 1122   CL 101 07/04/2020 1122   CO2 26 07/04/2020 1122   GLUCOSE 248 (H) 07/04/2020 1122   BUN 27 (H) 07/04/2020 1122   CREATININE 1.70 (H) 07/04/2020 1122   CALCIUM 9.3 07/04/2020 1122   PROT 6.8 07/04/2020 1122   ALBUMIN 2.8 (L) 04/25/2009 0610   AST 16 07/04/2020 1122   ALT 17 07/04/2020 1122   ALKPHOS 51 04/25/2009 0610   BILITOT 0.5 07/04/2020 1122   GFRNONAA 38 (L) 07/04/2020 1122   GFRAA 44 (L) 07/04/2020 1122   No results found for: CHOL, HDL, LDLCALC, LDLDIRECT, TRIG, CHOLHDL Lab Results  Component Value Date   HGBA1C 8.6 (A) 05/12/2020   Lab Results  Component Value Date   [id number] 952 02/07/2020   Lab Results  Component Value Date   TSH 1.46 02/07/2020    MMSE - Mini Mental State Exam 02/20/2020  Not completed: (No Data)  Orientation to time 3  Orientation to Place 4  Registration 3  Attention/ Calculation 0  Recall 1  Language- name 2 objects 2  Language- repeat 1  Language- follow 3 step  command 2  Language- read & follow direction 1  Write a sentence 1  Copy design 0  Total score 18     No flowsheet data found.   ASSESSMENT AND PLAN  75 y.o. year old male  has a past medical history of Aortic stenosis, Chest pain, Diabetes  mellitus type 2 in obese ([place]), HTN (hypertension), Hyperlipemia, Obesity, OSA (obstructive sleep apnea), PAF (paroxysmal atrial fibrillation) ([place]), and Sleep apnea. here with    Memory loss  History of sleep apnea  MSSA bacteremia  R[facility]mark is being treated for MSSA bacteremia as an outpatient. He has noted significant physical decline, however, memory seems fairly stable. He wishes to resume donepezil as he does feel this medication helped to improve memory for the period of time he was taking it. He will resume 34m every other day for 1-2 weeks, then increase dose to 538mdaily. If well tolerated, we can increase dose to 1097mn 1-2 months. We have discussed concerns of recovery following significant illness like sepsis. I have encouraged him to continue plans for physical therapy. He will continue close follow up with PCP. He will call me with any concerns or side effects after resuming donepezil. Memory compensation strategies reviewed. He will return to see us [place] 6 months, sooner if needed.   No orders of the defined types were placed in this encounter.    No orders of the defined types were placed in this encounter.     AmyDebbora PrestoSN, FNP-C 08/20/2020, 1:44 PM  [facility] [address]C [phone] reviewed the above note and documentation by the Nurse Practitioner and agree with the history, exam, assessment and plan as outlined above. I was available for consultation. SaiStar AgeD, PhD [facility] ([facility]

## 2020-08-21 ENCOUNTER — Ambulatory Visit (INDEPENDENT_AMBULATORY_CARE_PROVIDER_SITE_OTHER): Payer: Medicare Other | Admitting: Family Medicine

## 2020-08-21 ENCOUNTER — Other Ambulatory Visit: Payer: Self-pay

## 2020-08-21 ENCOUNTER — Encounter: Payer: Self-pay | Admitting: Family Medicine

## 2020-08-21 VITALS — BP 109/61 | HR 68 | Ht 67.0 in | Wt 221.0 lb

## 2020-08-21 DIAGNOSIS — R2681 Unsteadiness on feet: Secondary | ICD-10-CM | POA: Diagnosis not present

## 2020-08-21 DIAGNOSIS — R296 Repeated falls: Secondary | ICD-10-CM | POA: Diagnosis not present

## 2020-08-21 DIAGNOSIS — R413 Other amnesia: Secondary | ICD-10-CM

## 2020-08-21 DIAGNOSIS — I10 Essential (primary) hypertension: Secondary | ICD-10-CM

## 2020-08-21 DIAGNOSIS — Z9181 History of falling: Secondary | ICD-10-CM

## 2020-08-21 DIAGNOSIS — B9561 Methicillin susceptible Staphylococcus aureus infection as the cause of diseases classified elsewhere: Secondary | ICD-10-CM

## 2020-08-21 DIAGNOSIS — R7881 Bacteremia: Secondary | ICD-10-CM

## 2020-08-21 DIAGNOSIS — R197 Diarrhea, unspecified: Secondary | ICD-10-CM

## 2020-08-21 MED ORDER — DIPHENOXYLATE-ATROPINE 2.5-0.025 MG PO TABS: 1.0000 | ORAL_TABLET | Freq: Four times a day (QID) | ORAL | 0 refills | Status: AC | PRN

## 2020-08-21 NOTE — Assessment & Plan Note (Addendum)
He is nearly finished with course of Ancef for treatment of methicillin sensitive staph aureus bacteremia.  He will follow-up with infectious disease next week.  He does continue to have fatigue which I discussed may persist over the next few weeks given his fairly recent critical illness.

## 2020-08-21 NOTE — Assessment & Plan Note (Signed)
Is been difficult to get home health physical therapy set up for him.  Will place referral to outpatient physical therapy even though this is extremely burdensome for them.

## 2020-08-21 NOTE — Patient Instructions (Addendum)
Try lomotil as needed for diarrhea.  If confusion or memory problems worsen discontinue this medication.  I have placed orders for physical therapy here.   Follow up in 6 weeks.

## 2020-08-21 NOTE — Assessment & Plan Note (Signed)
Continues to have frequent diarrhea.  Antibiotics may be contributing some.  They can continue Imodium as needed.  I am adding Lomotil for him to use if having severe diarrhea that does not respond to Imodium.  We may need to get him set up with gastroenterology if this persists.  We did discuss that if they are noticing any increasing confusion or memory issues to discontinue Lomotil

## 2020-08-21 NOTE — Assessment & Plan Note (Signed)
Travis [name]'s blood pressure remains well controlled at this time.  He will continue current antihypertensive regimen.

## 2020-08-21 NOTE — Progress Notes (Signed)
Travis [name] - 75 y.o. male MRN [id number]  Date of birth: 08-09-1945  Subjective Chief Complaint  Patient presents with   Follow-up    HPI Travis [name] is a 75 year old male here today for follow-up visit.  He continues to have difficulty with balance and gait.  He has fallen a couple more times since the last time I saw him.  Orders have been entered for home health physical therapy however there has been difficulty finding an agency to accept him.  They would be willing to try outpatient physical therapy although this would be extremely cumbersome for them due to his mobility issues.  He did see neurology yesterday and donepezil was restarted as his diarrhea really has not improved since discontinuing this.  He continues to have cognitive deficits.  He also reports low energy levels.  He has felt this way since he got out of the hospital.  He continues on Ancef for treatment of methicillin sensitive staph aureus bacteremia.  He will see infectious disease again next week.  He has continued to have daily diarrhea.  He denies any nausea and appetite has been good.  There has been no blood in the stool.  C. difficile testing was negative previously.  I have tried Imodium which can help at times.  He is having some incontinence related to this.  He denies abdominal pain.   ROS:  A comprehensive ROS was completed and negative except as noted per HPI  Allergies  Allergen Reactions   Norvasc [Amlodipine Besylate] Itching   Penicillins Itching   Prinivil [Lisinopril] Itching    Past Medical History:  Diagnosis Date   Aortic stenosis    AVR 01/2009 with a Magna Ease #23 pericardial tissue valve; echo 07/22/11-EF 50-55%, aortic valve is well seated   Chest pain    myoview 11-14-2008- normal study   Diabetes mellitus type 2 in obese (HCC)    HTN (hypertension)    Hyperlipemia    Obesity    OSA (obstructive sleep apnea)    PAF (paroxysmal atrial fibrillation) ([facility])    on amiodarone briefly   Sleep  apnea     Past Surgical History:  Procedure Laterality Date   AORTIC VALVE REPLACEMENT  01/15/2009   AVR with a 32mm Magna Ease valve [id number], model 3300TFX   CARDIAC CATHETERIZATION  12/12/08   normal coronaries, nl EF, severe AS   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   fibrous malunion of sternotomy     Social History   Socioeconomic History   Marital status: Married    Spouse name: Not on file   Number of children: Not on file   Years of education: Not on file   Highest education level: Not on file  Occupational History   Not on file  Tobacco Use   Smoking status: Former    Types: Cigarettes    Quit date: 02/02/1988    Years since quitting: 32.5   Smokeless tobacco: Never  Substance and Sexual Activity   Alcohol use: No   Drug use: No   Sexual activity: Not on file  Other Topics Concern   Not on file  Social History Narrative   Not on file   Social Determinants of Health   Financial Resource Strain: Not on file  Food Insecurity: Not on file  Transportation Needs: Not on file  Physical Activity: Not on file  Stress: Not on file  Social Connections: Not on file    Family History  Problem Relation Age of Onset  Cancer Mother    Diabetes Brother     Health Maintenance  Topic Date Due   FOOT EXAM  Never done   OPHTHALMOLOGY EXAM  Never done   Hepatitis C Screening  Never done   Zoster Vaccines- Shingrix (1 of 2) Never done   COVID-19 Vaccine (4 - Booster for Pfizer series) 05/20/2020   INFLUENZA VACCINE  09/01/2020   HEMOGLOBIN A1C  11/11/2020   TETANUS/TDAP  10/14/2025   PNA vac Low Risk Adult  Completed   HPV VACCINES  Aged Out     ----------------------------------------------------------------------------------------------------------------------------------------------------------------------------------------------------------------- Physical Exam BP 109/61 (BP Location: Left Arm, Patient Position: Sitting, Cuff Size: Large)   Pulse 68   Ht 5\' 7"   (1.702 m)   Wt 221 lb (100.2 kg)   SpO2 96%   BMI 34.61 kg/m   Physical Exam Constitutional:      Appearance: Normal appearance.  Eyes:     General: No scleral icterus. Cardiovascular:     Rate and Rhythm: Normal rate and regular rhythm.  Pulmonary:     Effort: Pulmonary effort is normal.     Breath sounds: Normal breath sounds.  Musculoskeletal:     Cervical back: Neck supple.  Skin:    General: Skin is warm and dry.  Neurological:     General: No focal deficit present.     Mental Status: He is alert and oriented to person, place, and time.     Comments: Slow, unsteady gait.  Uses cane.  Psychiatric:        Mood and Affect: Mood normal.    ------------------------------------------------------------------------------------------------------------------------------------------------------------------------------------------------------------------- Assessment and Plan  Essential hypertension Travis [name]'s blood pressure remains well controlled at this time.  He will continue current antihypertensive regimen.  MSSA bacteremia He is nearly finished with course of Ancef for treatment of methicillin sensitive staph aureus bacteremia.  He will follow-up with infectious disease next week.  He does continue to have fatigue which I discussed may persist over the next few weeks given his fairly recent critical illness.  Memory loss He is seeing neurology.  Donepezil recently restarted.  Diarrhea Continues to have frequent diarrhea.  Antibiotics may be contributing some.  They can continue Imodium as needed.  I am adding Lomotil for him to use if having severe diarrhea that does not respond to Imodium.  We may need to get him set up with gastroenterology if this persists.  We did discuss that if they are noticing any increasing confusion or memory issues to discontinue Lomotil  At maximum risk for fall Is been difficult to get home health physical therapy set up for him.  Will place  referral to outpatient physical therapy even though this is extremely burdensome for them.   Meds ordered this encounter  Medications   diphenoxylate-atropine (LOMOTIL) 2.5-0.025 MG tablet    Sig: Take 1 tablet by mouth 4 (four) times daily as needed for diarrhea or loose stools.    Dispense:  30 tablet    Refill:  0    Return in about 6 weeks (around 10/02/2020) for Gait.    This visit occurred during the SARS-CoV-2 public health emergency.  Safety protocols were in place, including screening questions prior to the visit, additional usage of staff PPE, and extensive cleaning of exam room while observing appropriate contact time as indicated for disinfecting solutions.

## 2020-08-21 NOTE — Assessment & Plan Note (Signed)
He is seeing neurology.  Donepezil recently restarted.

## 2020-08-24 ENCOUNTER — Other Ambulatory Visit: Payer: Self-pay | Admitting: Family Medicine

## 2020-08-27 ENCOUNTER — Ambulatory Visit: Payer: Medicare Other | Admitting: Physical Therapy

## 2020-09-10 ENCOUNTER — Telehealth: Payer: Self-pay | Admitting: General Practice

## 2020-09-10 ENCOUNTER — Encounter: Payer: Self-pay | Admitting: Family Medicine

## 2020-09-10 NOTE — Telephone Encounter (Addendum)
Transition Care Management Follow-up Telephone Call Date of discharge and from where: 09/09/20 from [facility] How have you been since you were released from the hospital? His wife said that the patient has been admitted to rehab at [facility] rehab in [place]. His wife stated that he is very weak.  Any questions or concerns? No  Items Reviewed: Did the pt receive and understand the discharge instructions provided? Yes  Medications obtained and verified? Yes  Other? No  Any new allergies since your discharge? No  Dietary orders reviewed? Yes Do you have support at home?  Patient has been transferred to rehab. He is really weak. She said that he will be there for about 3-4 weeks.

## 2020-09-12 ENCOUNTER — Ambulatory Visit: Payer: Medicare Other | Admitting: Family Medicine

## 2020-09-15 ENCOUNTER — Ambulatory Visit: Payer: Medicare Other | Admitting: Family Medicine

## 2020-09-26 ENCOUNTER — Other Ambulatory Visit: Payer: Self-pay | Admitting: Family Medicine

## 2020-10-02 ENCOUNTER — Ambulatory Visit: Payer: Medicare Other | Admitting: Family Medicine

## 2020-10-09 ENCOUNTER — Other Ambulatory Visit: Payer: Self-pay | Admitting: Cardiovascular Disease

## 2020-10-14 ENCOUNTER — Other Ambulatory Visit: Payer: Self-pay | Admitting: Family Medicine

## 2020-11-01 DEATH — deceased

## 2021-02-23 ENCOUNTER — Ambulatory Visit: Payer: Medicare Other | Admitting: Family Medicine

## 2021-03-03 ENCOUNTER — Ambulatory Visit: Payer: Medicare Other | Admitting: Family Medicine

## 2022-03-13 IMAGING — MR MR HEAD W/O CM
10 series · 48 of 48 positions shown · non-contrast
Comparison: None.

CLINICAL DATA: Memory loss.

EXAM:
MRI HEAD WITHOUT CONTRAST
TECHNIQUE: Multiplanar, multiecho pulse sequences of the brain and surrounding
structures were obtained without intravenous contrast.

[Series 2: DWI · axial · 3.0mm · 1.20mm/px · z∈[-21,+123]mm · 7 of 108 slices shown (1 of 4)]
[im 1/108]
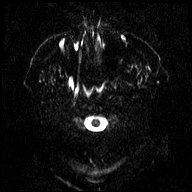
[im 18/108]
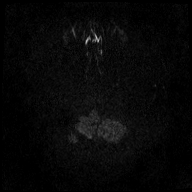
[im 36/108]
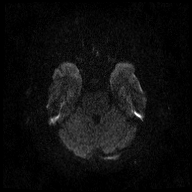
[im 54/108]
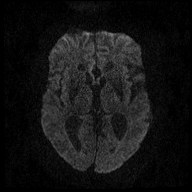
[im 72/108]
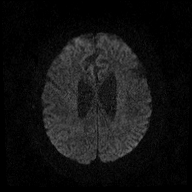
[im 90/108]
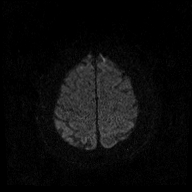
[im 108/108]
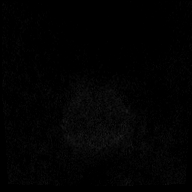

[Series 3: DWI · axial · 3.0mm · 1.20mm/px · z∈[-21,+123]mm · 4 of 55 slices shown (2 of 4)]
[im 1/55]
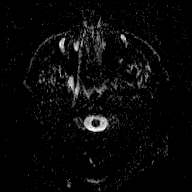
[im 19/55]
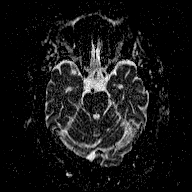
[im 37/55]
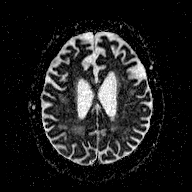
[im 55/55]
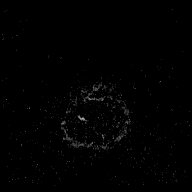

[Series 4: DWI · coronal · 3.0mm · 1.15mm/px · 7 of 98 slices shown (3 of 4)]
[im 1/98]
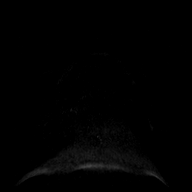
[im 17/98]
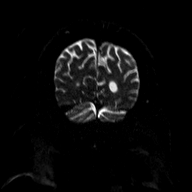
[im 33/98]
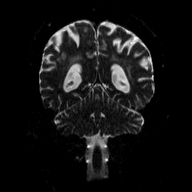
[im 49/98]
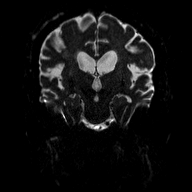
[im 65/98]
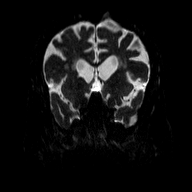
[im 81/98]
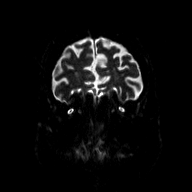
[im 98/98]
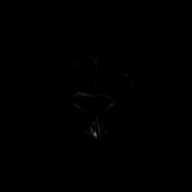

[Series 5: DWI · coronal · 3.0mm · 1.15mm/px · 3 of 50 slices shown (4 of 4)]
[im 1/50]
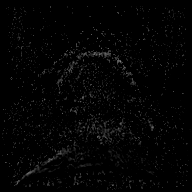
[im 25/50]
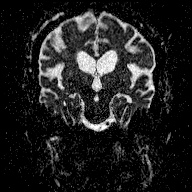
[im 50/50]
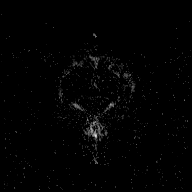

[Series 6: T1 · sagittal · 5.0mm · 0.45mm/px · 2 of 23 slices shown (1 of 2)]
[im 1/23]
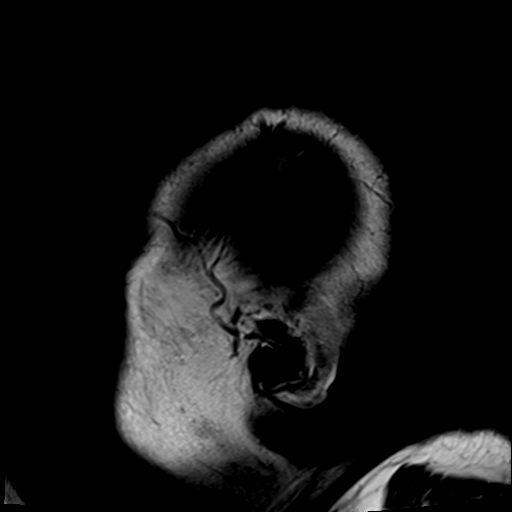
[im 23/23]
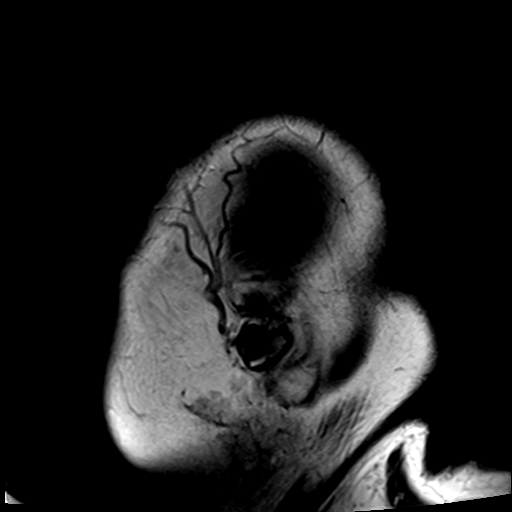

[Series 7: T2 · axial · 3.0mm · 0.72mm/px · z∈[-20,+123]mm · 4 of 55 slices shown (1 of 3)]
[im 1/55]
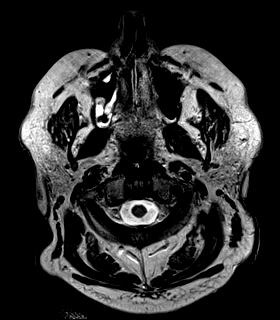
[im 19/55]
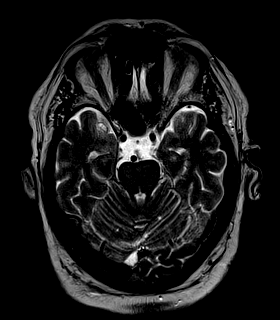
[im 37/55]
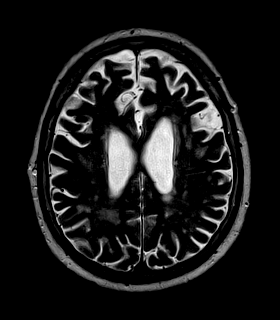
[im 55/55]
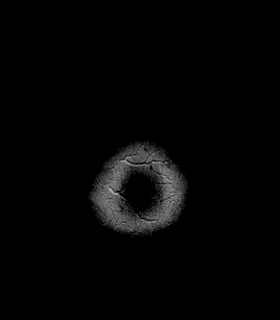

[Series 8: FLAIR · axial · 3.0mm · 0.45mm/px · z∈[-20,+123]mm · 4 of 55 slices shown]
[im 1/55]
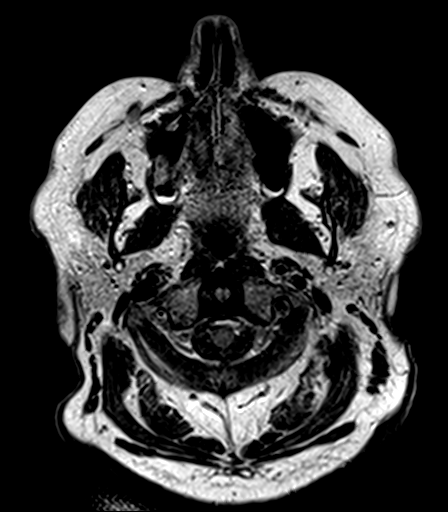
[im 19/55]
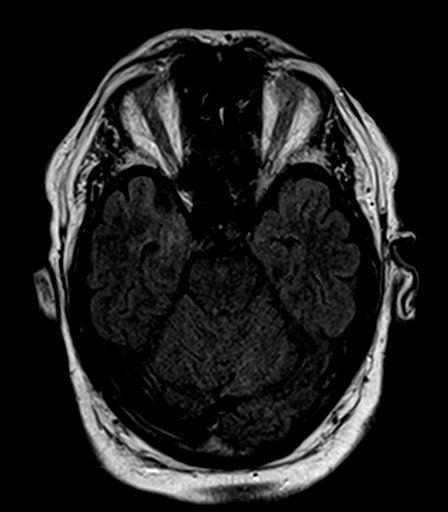
[im 37/55]
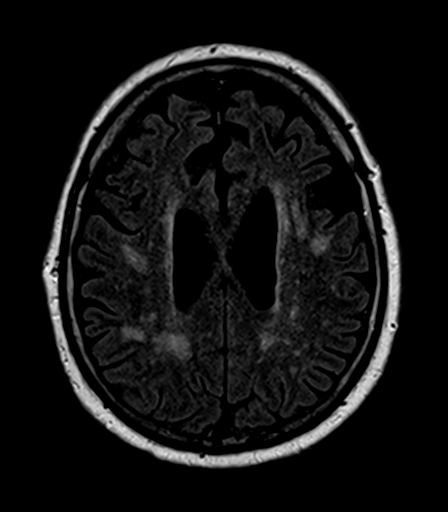
[im 55/55]
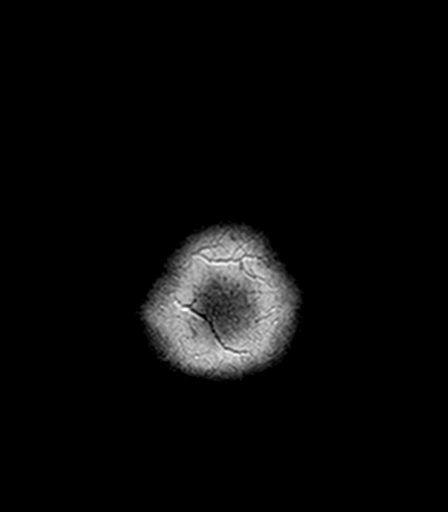

[Series 9: T2 · axial · 3.0mm · 0.72mm/px · z∈[-20,+123]mm · 4 of 55 slices shown (2 of 3)]
[im 1/55]
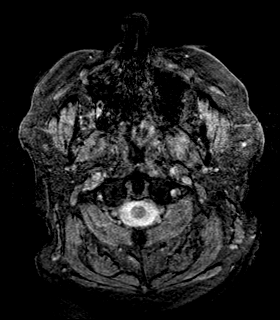
[im 19/55]
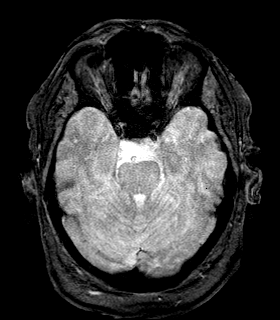
[im 37/55]
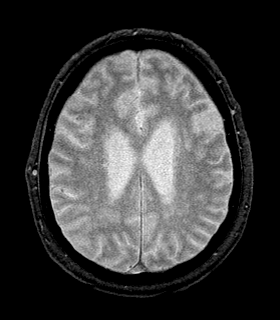
[im 55/55]
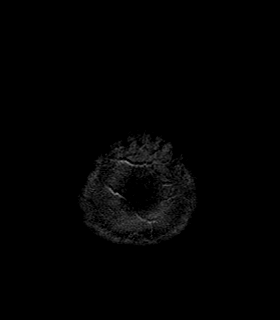

[Series 10: T1 · axial · 1.0mm · 1.00mm/px · z∈[-13,+128]mm · 11 of 160 slices shown (2 of 2)]
[im 1/160]
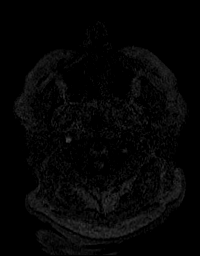
[im 16/160]
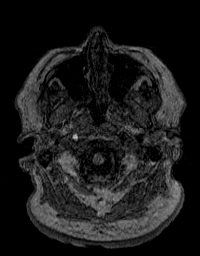
[im 32/160]
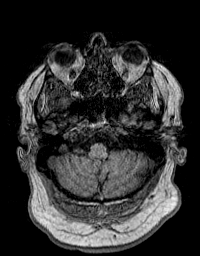
[im 48/160]
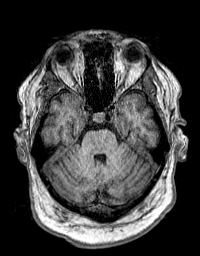
[im 64/160]
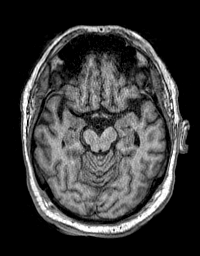
[im 80/160]
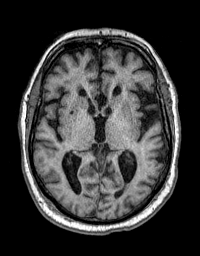
[im 96/160]
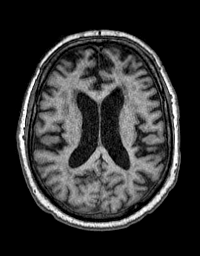
[im 112/160]
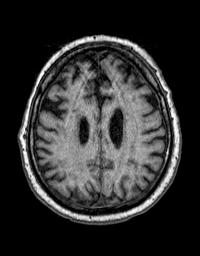
[im 128/160]
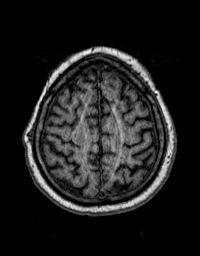
[im 144/160]
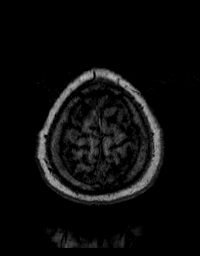
[im 160/160]
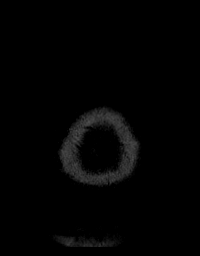

[Series 11: T2 · coronal · 5.0mm · 0.43mm/px · 2 of 29 slices shown (3 of 3)]
[im 1/29]
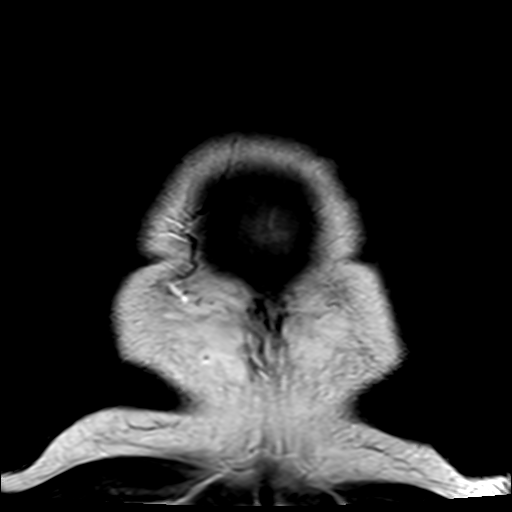
[im 29/29]
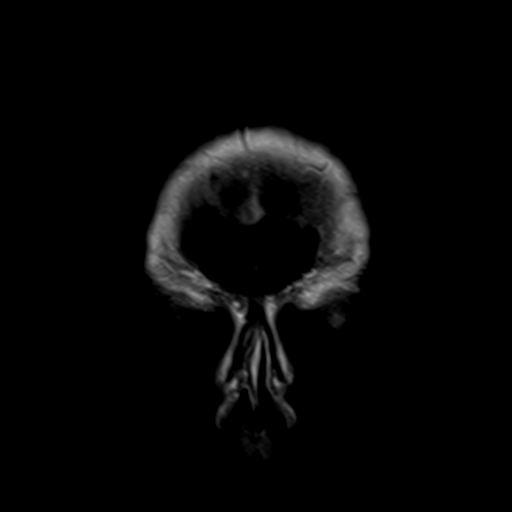

[48 of 48 positions shown; findings below may reference images not displayed]

FINDINGS: Brain: No acute infarction, hemorrhage, hydrocephalus, extra-axial
collection or mass lesion. Remote lacunar infarcts in the bilateral
cerebellar hemispheres and basal ganglia. Scattered and confluent
foci of T2 hyperintensity are seen within the white matter of
cerebral hemispheres, nonspecific, most likely related to chronic
small vessel ischemia. Moderate parenchymal volume loss.

Vascular: Normal flow voids.

Skull and upper cervical spine: Normal marrow signal.

Sinuses/Orbits: Mucosal thickening of the right maxillary sinus. The
orbits are maintained.
IMPRESSION: 1. No acute intracranial abnormality.
2. Remote lacunar infarcts in the bilateral cerebellar hemispheres
and basal ganglia.
3. Moderate parenchymal volume loss and chronic small vessel
ischemia.
